# Patient Record
Sex: Male | Born: 1960 | Race: Black or African American | Hispanic: No | Marital: Married | Smoking: Never smoker
Health system: Southern US, Community
[De-identification: ages and names within clinical notes are randomized; demographics above are authoritative.]

## PROBLEM LIST (undated history)

## (undated) HISTORY — PX: COLONOSCOPY: SHX174

## (undated) HISTORY — PX: WISDOM TOOTH EXTRACTION: SHX21

## (undated) HISTORY — PX: VASECTOMY: SHX75

---

## 2010-07-16 ENCOUNTER — Ambulatory Visit: Payer: Self-pay | Admitting: Internal Medicine

## 2010-07-16 DIAGNOSIS — R030 Elevated blood-pressure reading, without diagnosis of hypertension: Secondary | ICD-10-CM | POA: Insufficient documentation

## 2010-07-16 LAB — CONVERTED CEMR LAB
AST: 17 units/L (ref 0–37)
Albumin: 4.6 g/dL (ref 3.5–5.2)
Alkaline Phosphatase: 106 units/L (ref 39–117)
Calcium: 9.8 mg/dL (ref 8.4–10.5)
Cholesterol: 187 mg/dL (ref 0–200)
Creatinine, Ser: 1.07 mg/dL (ref 0.40–1.50)
HDL goal, serum: 40 mg/dL
HDL: 62 mg/dL (ref >39)
LDL Goal: 160 mg/dL
Total Protein: 7.4 g/dL (ref 6.0–8.3)
Triglycerides: 50 mg/dL (ref <150)

## 2010-07-17 ENCOUNTER — Encounter: Payer: Self-pay | Admitting: Internal Medicine

## 2010-09-17 ENCOUNTER — Ambulatory Visit: Payer: Self-pay | Admitting: Internal Medicine

## 2010-12-03 NOTE — Letter (Signed)
   [facility] [address] [place], [place]  [address]  [place] Phone: [phone]      July 17, 2010   Sigurd Berte [address] [place], [place] [address]  RE:  LAB RESULTS  Dear  Mr. Herbold,  The following is an interpretation of your most recent lab tests.  Please take note of any instructions provided or changes to medications that have resulted from your lab work.  ELECTROLYTES:  Good - no changes needed  KIDNEY FUNCTION TESTS:  Good - no changes needed  LIVER FUNCTION TESTS:  Good - no changes needed  LIPID PANEL:  Good - no changes needed Triglyceride: 50   Cholesterol: 187   LDL: 115   HDL: 62   Chol/HDL%:  3.0 Ratio  THYROID STUDIES:  Thyroid studies normal TSH: 1.860          Sincerely Yours,    Dr. Thomos Lemons  Appended Document:  Mailed.

## 2010-12-03 NOTE — Assessment & Plan Note (Signed)
Summary: NEW PT EST CARE/DT BCBS   Vital Signs:  Patient profile:   50 year old male Height:      70.75 inches Weight:      194.50 pounds BMI:     27.42 O2 Sat:      99 % on Room air Temp:     97.8 degrees F oral Pulse rate:   67 / minute Pulse rhythm:   regular Resp:     18 per minute BP sitting:   142 / 84  (left arm) Cuff size:   large  Vitals Entered By: Glendell Docker CMA (July 16, 2010 9:40 AM)  O2 Flow:  Room air  Contraindications/Deferment of Procedures/Staging:    Test/Procedure: FLU VAX    Reason for deferment: patient declined  CC: New Patient , Lipid Management Is Patient Diabetic? No Pain Assessment Patient in pain? no      Comments establlish care, no concerns, fasting for blood work   Primary Care Provider:  D. Thomos Lemons DO  CC:  New Patient  and Lipid Management.  History of Present Illness: 50 y/o  AA male to establish.  husband is Elmo [place] stays physically active through work  hx of elevated BP readings - in college he was evaluated at [facility] - w/u secondary htn reported negative he was tx'ed for 1 yr but stopped treatments.  bp elevation attributed to white coat hypertension follows BP at home - SBP 140's, DBP 70 -80's  Lipid Management History:      Positive NCEP/ATP III risk factors include male age 69 years old or older.  Negative NCEP/ATP III risk factors include non-tobacco-user status.    Preventive Screening-Counseling & Management  Alcohol-Tobacco     Alcohol drinks/day: 0     Smoking Status: never  Caffeine-Diet-Exercise     Caffeine use/day: None     Does Patient Exercise: no  Allergies (verified): No Known Drug Allergies  Past History:  Past Medical History: Hx of elevated BP w/o diagnosis of Htn  Past Surgical History: Denies surgical history Vasectomy   Family History: Family History High cholesterol - mother Family History Hypertension Family History Diabetes 1st degree relative - brother (he  was diagnosed at age 88) parents are living mom 45 , father 49 2 brothers, 1 sister no colon ca no prostate ca  Social History: 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    Physical Exam  General:  alert, well-developed, and well-nourished.   Head:  normocephalic and no abnormalities observed.   Mouth:  pharynx pink and moist.   Neck:  No deformities, masses, or tenderness noted.no carotid bruits.   Lungs:  normal breath sounds, no crackles, and no wheezes.   Heart:  normal rate, regular rhythm, no murmur, and no gallop.   Abdomen:  soft, non-tender, normal bowel sounds, no masses, no hepatomegaly, and no splenomegaly.   Extremities:  No lower extremity edema Neurologic:  cranial nerves II-XII intact and gait normal.   Psych:  normally interactive, good eye contact, not anxious appearing, and not depressed appearing.     Impression &  Recommendations:  Problem # 1:  HEALTH MAINTENANCE EXAM (ICD-V70.0) Reviewed adult health maintenance protocols.  Orders: T-Basic Metabolic Panel [id number]) T-Lipid Profile [id number]) T-TSH [id number]) T-Hepatic Function [id number])  Td Booster: Tdap (07/16/2010)   Flu Vax: Declined (07/16/2010)     Problem # 2:  ELEVATED BLOOD PRESSURE WITHOUT DIAGNOSIS OF HYPERTENSION (ICD-796.2)  BP today: 142/84  10 Yr Risk Heart Disease: Not enough information  Pt counseled on diet and exercise. monitor home bp readings.  consider start ARB  Other Orders: Tdap => 40yrs IM  (30160) Admin 1st Vaccine (10932)  Lipid Assessment/Plan:      Based on NCEP/ATP III, the patient's risk factor category is "0-1 risk factors".  The patient's lipid goals are as follows: Total cholesterol goal is 200; LDL cholesterol goal is 160; HDL cholesterol goal is 40; Triglyceride goal is 150.     Patient Instructions: 1)  Please schedule a follow-up appointment in 2 months. 2)  Bring BP log (at least 10 readings) to your next appointment                                                                                                                                     Current Allergies (reviewed today): No known allergies    Immunization History:  Influenza Immunization History:    Influenza:  declined (07/16/2010)  Immunizations Administered:  Tetanus Vaccine:    Vaccine Type: Tdap    Site: left deltoid    Mfr: GlaxoSmithKline    Dose: 0.5 ml    Route: IM    Given by: Glendell Docker CMA    Exp. Date: 07/24/2012    Lot #: [id number]    VIS given: 09/20/08 version given July 16, 2010.

## 2010-12-03 NOTE — Assessment & Plan Note (Signed)
Summary: 2 month fu/dt   Vital Signs:  Patient profile:   50 year old male Height:      70.75 inches Weight:      200.75 pounds BMI:     28.30 O2 Sat:      100 % on Room air Temp:     98.3 degrees F oral Pulse rate:   81 / minute Resp:     16 per minute BP sitting:   130 / 70  (right arm) Cuff size:   large  Vitals Entered By: Glendell Docker CMA (September 17, 2010 9:30 AM)  O2 Flow:  Room air CC: 2 Month Follow up  Is Patient Diabetic? No Pain Assessment Patient in pain? no      Comments no concerns   Primary Care Provider:  Dondra Spry DO  CC:  2 Month Follow up .  History of Present Illness: 50 y/o AA male for f/u he has been monitoring bp at home occ elevated readings he has decrease salt intake.  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   Lungs:  normal breath sounds, no crackles, and no wheezes.   Heart:  normal rate, regular rhythm, no murmur, and no gallop.   Extremities:  No lower extremity edema   Impression & Recommendations:  Problem # 1:  ELEVATED BLOOD PRESSURE WITHOUT DIAGNOSIS OF HYPERTENSION (ICD-796.2) Pt with occ elevated readings at home.  130-140's systolic continue with DASH  diet for now.  Pt advised to schedule visit with opthalmogist.  If pt has any signs of retinal hypertensive changes, we discussed starting low dose ACE or ARB  BP today: 130/70 Prior BP: 142/84 (07/16/2010)  Prior 10 Yr Risk Heart Disease: Not enough information (07/16/2010)  Labs Reviewed: Creat: 1.07 (07/16/2010) Chol: 187 (07/16/2010)   HDL: 62 (07/16/2010)   LDL: 115 (07/16/2010)   TG: 50 (07/16/2010)  Instructed in low sodium diet (DASH Handout) and behavior modification.    Patient Instructions: 1)  Follow low salt diet - limit to 2.5 grams per day 2)  Please schedule a follow-up appointment in 1 year. 3)  Follow up eye doctor. 4)  BMP prior to visit, ICD-9:  401.9 5)  Lipid Panel prior to visit, ICD-9:  401.9 6)  High sensitivity CRP :  401.9 7)  Please return for lab work one (1) week before your next appointment.    Orders Added: 1)  Est. Patient Level III [[address]]        Current Allergies (reviewed today): No known allergies

## 2012-08-27 ENCOUNTER — Encounter: Payer: Self-pay | Admitting: Internal Medicine

## 2012-08-27 ENCOUNTER — Encounter: Payer: Self-pay | Admitting: Gastroenterology

## 2012-08-27 ENCOUNTER — Telehealth: Payer: Self-pay | Admitting: Internal Medicine

## 2012-08-27 ENCOUNTER — Ambulatory Visit (INDEPENDENT_AMBULATORY_CARE_PROVIDER_SITE_OTHER): Payer: BC Managed Care – PPO | Admitting: Internal Medicine

## 2012-08-27 VITALS — BP 128/80 | HR 68 | Temp 97.8°F | Resp 16 | Ht 70.5 in | Wt 201.5 lb

## 2012-08-27 DIAGNOSIS — Z0000 Encounter for general adult medical examination without abnormal findings: Secondary | ICD-10-CM | POA: Insufficient documentation

## 2012-08-27 DIAGNOSIS — Z1211 Encounter for screening for malignant neoplasm of colon: Secondary | ICD-10-CM

## 2012-08-27 NOTE — Assessment & Plan Note (Addendum)
Normal exam. EKG obtained demonstrates normal sinus rhythm with evidence of early repolarization. Axis and intervals normal. Obtain CPE labs including PSA (discussed pros and cons). Declines influenza vaccine. Colonoscopy referral

## 2012-08-27 NOTE — Telephone Encounter (Signed)
Lab order Monday 08-30-2012 and lab order 08-16-2013 Cbc, chem7, lipid, lft, tsh, psa and ua with reflex micro-v70.0

## 2012-08-27 NOTE — Progress Notes (Signed)
  Subjective:    Patient ID: Gary Colon, male    DOB: [birth year], 51 y.o.   MRN: [id number]  HPI Pt presents to clinic for annual exam. No active complaints. Has not undergone colonoscopy but is willing to do so. Declines influenza vaccine No past medical history on file. No past surgical history on file.  does not have a smoking history on file. He does not have any smokeless tobacco history on file. His alcohol and drug histories not on file. family history is not on file. Allergies not on file   Review of Systems see hpi     Objective:   Physical Exam  Physical Exam  Nursing note and vitals reviewed. Constitutional: He appears well-developed and well-nourished. No distress.  HENT:  Head: Normocephalic and atraumatic.  Right Ear: Tympanic membrane and external ear normal.  Left Ear: Tympanic membrane and external ear normal.  Nose: Nose normal.  Mouth/Throat: Uvula is midline, oropharynx is clear and moist and mucous membranes are normal. No oropharyngeal exudate.  Eyes: Conjunctivae and EOM are normal. Pupils are equal, round, and reactive to light. Right eye exhibits no discharge. Left eye exhibits no discharge. No scleral icterus.  Neck: Neck supple. Carotid bruit is not present. No thyromegaly present.  Cardiovascular: Normal rate, regular rhythm and normal heart sounds.  Exam reveals no gallop and no friction rub.   No murmur heard. Pulmonary/Chest: Effort normal and breath sounds normal. No respiratory distress. He has no wheezes. He has no rales.  Abdominal: Soft. He exhibits no distension and no mass. There is no hepatosplenomegaly. There is no tenderness. There is no rebound. Hernia confirmed negative in the right inguinal area and confirmed negative in the left inguinal area.  Lymphadenopathy:    He has no cervical adenopathy.      Neurological: He is alert.  Skin: Skin is warm and dry. He is not diaphoretic.  Psychiatric: He has a normal mood and affect.          Assessment & Plan:

## 2012-08-27 NOTE — Patient Instructions (Signed)
Please schedule fasting labs for Monday Cbc, chem7, lipid, lft, tsh, psa and ua with reflex micro-v70.0 Also please schedule the same labs prior to your next year's physical

## 2012-08-30 ENCOUNTER — Telehealth: Payer: Self-pay | Admitting: *Deleted

## 2012-08-30 DIAGNOSIS — Z0000 Encounter for general adult medical examination without abnormal findings: Secondary | ICD-10-CM

## 2012-08-30 LAB — CBC WITH DIFFERENTIAL/PLATELET
Basophils Relative: 0 % (ref 0–1)
Eosinophils Absolute: 0.1 10*3/uL (ref 0.0–0.7)
Eosinophils Relative: 1 % (ref 0–5)
Hemoglobin: 14.6 g/dL (ref 13.0–17.0)
MCH: 28.9 pg (ref 26.0–34.0)
MCHC: 34.2 g/dL (ref 30.0–36.0)
MCV: 84.6 fL (ref 78.0–100.0)
Monocytes Absolute: 0.6 10*3/uL (ref 0.1–1.0)
Monocytes Relative: 10 % (ref 3–12)
Neutrophils Relative %: 59 % (ref 43–77)

## 2012-08-30 LAB — BASIC METABOLIC PANEL
BUN: 15 mg/dL (ref 6–23)
Chloride: 103 mEq/L (ref 96–112)
Creat: 1.2 mg/dL (ref 0.50–1.35)

## 2012-08-30 LAB — HEPATIC FUNCTION PANEL
ALT: 19 U/L (ref 0–53)
Bilirubin, Direct: <0.1 mg/dL (ref 0.0–0.3)
Total Bilirubin: 0.2 mg/dL — ABNORMAL LOW (ref 0.3–1.2)

## 2012-08-30 LAB — LIPID PANEL
Cholesterol: 171 mg/dL (ref 0–200)
Triglycerides: 64 mg/dL (ref <150)
VLDL: 13 mg/dL (ref 0–40)

## 2012-08-30 LAB — TSH: TSH: 2.068 u[IU]/mL (ref 0.350–4.500)

## 2012-08-30 NOTE — Telephone Encounter (Signed)
Pt presented to the lab. Orders entered per 08/27/12 office visit.  Please schedule fasting labs for Monday  Cbc, chem7, lipid, lft, tsh, psa and ua with reflex micro-v70.0

## 2012-08-31 LAB — URINALYSIS, ROUTINE W REFLEX MICROSCOPIC
Bilirubin Urine: NEGATIVE
Glucose, UA: NEGATIVE mg/dL
Ketones, ur: NEGATIVE mg/dL
Specific Gravity, Urine: 1.025 (ref 1.005–1.030)
pH: 6 (ref 5.0–8.0)

## 2012-09-14 ENCOUNTER — Ambulatory Visit (AMBULATORY_SURGERY_CENTER): Payer: BC Managed Care – PPO

## 2012-09-14 ENCOUNTER — Encounter: Payer: Self-pay | Admitting: Gastroenterology

## 2012-09-14 VITALS — Ht 71.5 in | Wt 207.1 lb

## 2012-09-14 DIAGNOSIS — Z1211 Encounter for screening for malignant neoplasm of colon: Secondary | ICD-10-CM

## 2012-09-14 MED ORDER — NA SULFATE-K SULFATE-MG SULF 17.5-3.13-1.6 GM/177ML PO SOLN: 1.0000 | Freq: Once | ORAL | Status: DC

## 2012-10-04 ENCOUNTER — Telehealth: Payer: Self-pay | Admitting: Gastroenterology

## 2012-10-04 NOTE — Telephone Encounter (Signed)
Checked with pt's pharmacy.  Suprep rx is ready for pt to pickup.  Pt notified.

## 2012-10-06 ENCOUNTER — Encounter: Payer: Self-pay | Admitting: Gastroenterology

## 2012-10-06 ENCOUNTER — Ambulatory Visit (AMBULATORY_SURGERY_CENTER): Payer: BC Managed Care – PPO | Admitting: Gastroenterology

## 2012-10-06 VITALS — BP 116/68 | HR 62 | Temp 97.4°F | Resp 20 | Ht 72.0 in | Wt 207.0 lb

## 2012-10-06 DIAGNOSIS — Z1211 Encounter for screening for malignant neoplasm of colon: Secondary | ICD-10-CM

## 2012-10-06 MED ORDER — SODIUM CHLORIDE 0.9 % IV SOLN: 500.0000 mL | INTRAVENOUS | Status: DC

## 2012-10-06 NOTE — Patient Instructions (Signed)
Normal colonoscopy   YOU HAD AN ENDOSCOPIC PROCEDURE TODAY AT THE [facility]: Refer to the procedure report that was given to you for any specific questions about what was found during the examination.  If the procedure report does not answer your questions, please call your gastroenterologist to clarify.  If you requested that your care partner not be given the details of your procedure findings, then the procedure report has been included in a sealed envelope for you to review at your convenience later.  YOU SHOULD EXPECT: Some feelings of bloating in the abdomen. Passage of more gas than usual.  Walking can help get rid of the air that was put into your GI tract during the procedure and reduce the bloating. If you had a lower endoscopy (such as a colonoscopy or flexible sigmoidoscopy) you may notice spotting of blood in your stool or on the toilet paper. If you underwent a bowel prep for your procedure, then you may not have a normal bowel movement for a few days.  DIET: Your first meal following the procedure should be a light meal and then it is ok to progress to your normal diet.  A half-sandwich or bowl of soup is an example of a good first meal.  Heavy or fried foods are harder to digest and may make you feel nauseous or bloated.  Likewise meals heavy in dairy and vegetables can cause extra gas to form and this can also increase the bloating.  Drink plenty of fluids but you should avoid alcoholic beverages for 24 hours.  ACTIVITY: Your care partner should take you home directly after the procedure.  You should plan to take it easy, moving slowly for the rest of the day.  You can resume normal activity the day after the procedure however you should NOT DRIVE or use heavy machinery for 24 hours (because of the sedation medicines used during the test).    SYMPTOMS TO REPORT IMMEDIATELY: A gastroenterologist can be reached at any hour.  During normal business hours, 8:30 AM to 5:00 PM  Monday through Friday, call [phone].  After hours and on weekends, please call the GI answering service at [phone] who will take a message and have the physician on call contact you.   Following lower endoscopy (colonoscopy or flexible sigmoidoscopy):  Excessive amounts of blood in the stool  Significant tenderness or worsening of abdominal pains  Swelling of the abdomen that is new, acute  Fever of 100F or higher   FOLLOW UP: If any biopsies were taken you will be contacted by phone or by letter within the next 1-3 weeks.  Call your gastroenterologist if you have not heard about the biopsies in 3 weeks.  Our staff will call the home number listed on your records the next business day following your procedure to check on you and address any questions or concerns that you may have at that time regarding the information given to you following your procedure. This is a courtesy call and so if there is no answer at the home number and we have not heard from you through the emergency physician on call, we will assume that you have returned to your regular daily activities without incident.  SIGNATURES/CONFIDENTIALITY: You and/or your care partner have signed paperwork which will be entered into your electronic medical record.  These signatures attest to the fact that that the information above on your After Visit Summary has been reviewed and is understood.  Full responsibility  of the confidentiality of this discharge information lies with you and/or your care-partner.

## 2012-10-06 NOTE — Op Note (Signed)
[facility] [address] [place], [address]   COLONOSCOPY PROCEDURE REPORT  PATIENT: Gary Colon, Gary Colon  MR#: [id number] BIRTHDATE: 1961-09-27 , 51  yrs. old GENDER: Male ENDOSCOPIST: Louis Meckel, MD REFERRED BY: PROCEDURE DATE:  10/06/2012 PROCEDURE:   Colonoscopy, diagnostic ASA CLASS:   Class I INDICATIONS:average risk screening. MEDICATIONS: MAC sedation, administered by CRNA and propofol (Diprivan) 250mg  IV  DESCRIPTION OF PROCEDURE:   After the risks benefits and alternatives of the procedure were thoroughly explained, informed consent was obtained.  A digital rectal exam revealed no abnormalities of the rectum.   The LB [id number]  endoscope was introduced through the anus and advanced to the cecum, which was identified by both the appendix and ileocecal valve. No adverse events experienced.   The quality of the prep was Suprep excellent The instrument was then slowly withdrawn as the colon was fully examined.      COLON FINDINGS: A normal appearing cecum, ileocecal valve, and appendiceal orifice were identified.  The ascending, hepatic flexure, transverse, splenic flexure, descending, sigmoid colon and rectum appeared unremarkable.  No polyps or cancers were seen. Retroflexed views revealed no abnormalities. The time to cecum=3 minutes 0 seconds.  Withdrawal time=6 minutes 0 seconds.  The scope was withdrawn and the procedure completed. COMPLICATIONS: There were no complications.  ENDOSCOPIC IMPRESSION: Normal colon  RECOMMENDATIONS: Continue current colorectal screening recommendations for "routine risk" patients with a repeat colonoscopy in 10 years.   eSigned:  Louis Meckel, MD 10/06/2012 12:06 PM   cc:

## 2012-10-06 NOTE — Progress Notes (Signed)
Patient did not experience any of the following events: a burn prior to discharge; a fall within the facility; wrong site/side/patient/procedure/implant event; or a hospital transfer or hospital admission upon discharge from the facility. (G8907) Patient did not have preoperative order for IV antibiotic SSI prophylaxis. (G8918)  

## 2012-10-07 ENCOUNTER — Telehealth: Payer: Self-pay | Admitting: *Deleted

## 2012-10-07 NOTE — Telephone Encounter (Signed)
  Follow up Call-  Call back number 10/06/2012  Post procedure Call Back phone  # [phone]  Permission to leave phone message Yes     Patient questions:  Message left to call if necessary.

## 2013-08-10 ENCOUNTER — Encounter: Payer: Self-pay | Admitting: Internal Medicine

## 2013-08-29 ENCOUNTER — Encounter: Payer: BC Managed Care – PPO | Admitting: Internal Medicine

## 2013-08-31 ENCOUNTER — Encounter: Payer: Self-pay | Admitting: Physician Assistant

## 2013-08-31 ENCOUNTER — Ambulatory Visit (INDEPENDENT_AMBULATORY_CARE_PROVIDER_SITE_OTHER): Payer: BC Managed Care – PPO | Admitting: Physician Assistant

## 2013-08-31 VITALS — BP 136/88 | HR 70 | Temp 97.7°F | Ht 71.0 in | Wt 203.8 lb

## 2013-08-31 DIAGNOSIS — Z0000 Encounter for general adult medical examination without abnormal findings: Secondary | ICD-10-CM

## 2013-08-31 LAB — CBC WITH DIFFERENTIAL/PLATELET
Basophils Absolute: 0 10*3/uL (ref 0.0–0.1)
Basophils Relative: 0 % (ref 0–1)
Eosinophils Absolute: 0 10*3/uL (ref 0.0–0.7)
Hemoglobin: 14.8 g/dL (ref 13.0–17.0)
MCH: 29.2 pg (ref 26.0–34.0)
MCHC: 34.3 g/dL (ref 30.0–36.0)
Monocytes Absolute: 0.6 10*3/uL (ref 0.1–1.0)
Monocytes Relative: 8 % (ref 3–12)
Neutro Abs: 4.7 10*3/uL (ref 1.7–7.7)
Neutrophils Relative %: 71 % (ref 43–77)
RDW: 13.7 % (ref 11.5–15.5)

## 2013-08-31 LAB — BASIC METABOLIC PANEL
BUN: 16 mg/dL (ref 6–23)
Calcium: 9.9 mg/dL (ref 8.4–10.5)
Glucose, Bld: 106 mg/dL — ABNORMAL HIGH (ref 70–99)
Sodium: 138 mEq/L (ref 135–145)

## 2013-08-31 LAB — HEMOGLOBIN A1C
Hgb A1c MFr Bld: 5.8 % — ABNORMAL HIGH (ref <5.7)
Mean Plasma Glucose: 120 mg/dL — ABNORMAL HIGH (ref <117)

## 2013-08-31 LAB — LIPID PANEL
Cholesterol: 174 mg/dL (ref 0–200)
HDL: 62 mg/dL (ref >39)
LDL Cholesterol: 104 mg/dL — ABNORMAL HIGH (ref 0–99)
Total CHOL/HDL Ratio: 2.8 Ratio
Triglycerides: 40 mg/dL (ref <150)
VLDL: 8 mg/dL (ref 0–40)

## 2013-08-31 LAB — HEPATIC FUNCTION PANEL
Albumin: 4.7 g/dL (ref 3.5–5.2)
Bilirubin, Direct: 0.1 mg/dL (ref 0.0–0.3)
Total Bilirubin: 0.4 mg/dL (ref 0.3–1.2)

## 2013-08-31 LAB — TSH: TSH: 1.456 u[IU]/mL (ref 0.350–4.500)

## 2013-08-31 NOTE — Assessment & Plan Note (Signed)
Will obtain fasting labs today, including PSA.  Patient up-to-date on health maintenance.  Declines flu shot.  Encourage patient to begin a daily multivitamin.

## 2013-08-31 NOTE — Progress Notes (Signed)
Patient ID: Gary Colon, male   DOB: [birth year], 52 y.o.   MRN: [id number]  Patient presents today for annual exam.  Acute Concerns: No acute concerns  Chronic Issues: No significant PMH  Health Maintenance: Dental -- up-to-date Vision -- overdue. Colonoscopy -- 2013; no abnormal findings -- due in 10 years. Immunizations -- up-to-date.  Declines flu shot.   History reviewed. No pertinent past medical history.  No current outpatient prescriptions on file prior to visit.   No current facility-administered medications on file prior to visit.    No Known Allergies  Family History  Problem Relation Age of Onset  . Gallstones Father   . Hypertension Mother   . Healthy Brother     x2  . Healthy Sister     x1  . Healthy Son     x2    History   Social History  . Marital Status: Married    Spouse Name: N/A    Number of Children: N/A  . Years of Education: N/A   Social History Main Topics  . Smoking status: Never Smoker   . Smokeless tobacco: Never Used  . Alcohol Use: No  . Drug Use: No  . Sexual Activity: Yes    Birth Control/ Protection: None     Comment: wife   Other Topics Concern  . None   Social History Narrative  . None   Review of Systems  Constitutional: Negative for fever, chills, weight loss and malaise/fatigue.  HENT: Negative for ear discharge, ear pain, hearing loss and tinnitus.   Eyes: Negative for blurred vision, double vision, photophobia and pain.  Respiratory: Negative for cough, shortness of breath and wheezing.   Cardiovascular: Negative for chest pain and palpitations.  Gastrointestinal: Negative for heartburn, nausea, vomiting, abdominal pain, diarrhea, constipation, blood in stool and melena.  Genitourinary: Negative for dysuria, urgency, frequency, hematuria and flank pain.  Neurological: Negative for dizziness, seizures, loss of consciousness and headaches.  Endo/Heme/Allergies: Negative for environmental allergies.   Psychiatric/Behavioral: Negative for depression, suicidal ideas, hallucinations and substance abuse. The patient is not nervous/anxious and does not have insomnia.    Filed Vitals:   08/31/13 0839  BP: 136/88  Pulse: 70  Temp: 97.7 F (36.5 C)    Physical Exam  Vitals reviewed. Constitutional: He is oriented to person, place, and time and well-developed, well-nourished, and in no distress.  HENT:  Head: Normocephalic and atraumatic.  Right Ear: External ear normal.  Left Ear: External ear normal.  Nose: Nose normal.  Mouth/Throat: Oropharynx is clear and moist. No oropharyngeal exudate.  Tympanic membranes WNL bilaterally.  Eyes: Conjunctivae and EOM are normal. Pupils are equal, round, and reactive to light. No scleral icterus.  Neck: Normal range of motion. Neck supple.  Cardiovascular: Normal rate, regular rhythm, normal heart sounds and intact distal pulses.   Pulmonary/Chest: Effort normal and breath sounds normal. No respiratory distress. He has no wheezes. He has no rales. He exhibits no tenderness.  Abdominal: Soft. Bowel sounds are normal. He exhibits no distension and no mass. There is no tenderness. There is no rebound and no guarding.  Musculoskeletal: Normal range of motion.  Lymphadenopathy:    He has no cervical adenopathy.  Neurological: He is alert and oriented to person, place, and time. No cranial nerve deficit.  Skin: Skin is warm and dry. No rash noted.  Psychiatric: Affect normal.   Assessment/Plan: Visit for preventive health examination Will obtain fasting labs today, including PSA.  Patient up-to-date on health maintenance.  Declines flu shot.  Encourage patient to begin a daily multivitamin.

## 2013-08-31 NOTE — Patient Instructions (Signed)
Please obtain labs.  I will call you with your results.  Consider taking a daily multivitamin like Men's One a Day.  Please return to clinic in 1 year for annual exam and/or sooner when you need [place].

## 2013-09-01 LAB — URINALYSIS, ROUTINE W REFLEX MICROSCOPIC
Bilirubin Urine: NEGATIVE
Glucose, UA: NEGATIVE mg/dL
Hgb urine dipstick: NEGATIVE
Ketones, ur: NEGATIVE mg/dL
Leukocytes, UA: NEGATIVE
Nitrite: NEGATIVE
Protein, ur: NEGATIVE mg/dL
Specific Gravity, Urine: 1.012 (ref 1.005–1.030)
pH: 6 (ref 5.0–8.0)

## 2013-09-08 ENCOUNTER — Other Ambulatory Visit: Payer: Self-pay

## 2014-09-01 ENCOUNTER — Telehealth: Payer: Self-pay | Admitting: *Deleted

## 2014-09-01 ENCOUNTER — Encounter: Payer: BC Managed Care – PPO | Admitting: Physician Assistant

## 2014-09-01 DIAGNOSIS — Z0289 Encounter for other administrative examinations: Secondary | ICD-10-CM

## 2014-09-01 NOTE — Telephone Encounter (Signed)
Patient can reschedule at his earliest convenience.

## 2014-09-01 NOTE — Telephone Encounter (Signed)
Pt did not show for appointment 09/01/2014 at 8:30am for CPE

## 2016-07-17 ENCOUNTER — Other Ambulatory Visit: Payer: Self-pay | Admitting: Family Medicine

## 2016-07-22 ENCOUNTER — Other Ambulatory Visit: Payer: Self-pay | Admitting: Family Medicine

## 2016-07-22 DIAGNOSIS — N5089 Other specified disorders of the male genital organs: Secondary | ICD-10-CM

## 2016-07-28 ENCOUNTER — Ambulatory Visit
Admission: RE | Admit: 2016-07-28 | Discharge: 2016-07-28 | Disposition: A | Discharge status: Home / Self Care | Payer: BLUE CROSS/BLUE SHIELD | Source: Ambulatory Visit | Attending: Family Medicine | Admitting: Family Medicine

## 2016-07-28 DIAGNOSIS — N5089 Other specified disorders of the male genital organs: Secondary | ICD-10-CM

## 2016-12-29 ENCOUNTER — Other Ambulatory Visit: Payer: Self-pay | Admitting: *Deleted

## 2017-07-03 ENCOUNTER — Other Ambulatory Visit: Payer: Self-pay | Admitting: Family Medicine

## 2017-07-03 DIAGNOSIS — R1083 Colic: Secondary | ICD-10-CM

## 2017-07-03 DIAGNOSIS — R1011 Right upper quadrant pain: Secondary | ICD-10-CM

## 2017-07-10 ENCOUNTER — Ambulatory Visit
Admission: RE | Admit: 2017-07-10 | Discharge: 2017-07-10 | Disposition: A | Discharge status: Home / Self Care | Payer: BC Managed Care – PPO | Source: Ambulatory Visit | Attending: Family Medicine | Admitting: Family Medicine

## 2017-07-10 DIAGNOSIS — R1083 Colic: Secondary | ICD-10-CM

## 2017-07-10 DIAGNOSIS — R1011 Right upper quadrant pain: Secondary | ICD-10-CM

## 2017-07-13 ENCOUNTER — Emergency Department (HOSPITAL_COMMUNITY)
Admission: EM | Admit: 2017-07-13 | Discharge: 2017-07-13 | Disposition: A | Discharge status: Home / Self Care | Payer: BC Managed Care – PPO | Attending: Emergency Medicine | Admitting: Emergency Medicine

## 2017-07-13 ENCOUNTER — Encounter (HOSPITAL_COMMUNITY): Payer: Self-pay

## 2017-07-13 DIAGNOSIS — K8050 Calculus of bile duct without cholangitis or cholecystitis without obstruction: Secondary | ICD-10-CM | POA: Insufficient documentation

## 2017-07-13 DIAGNOSIS — R1013 Epigastric pain: Secondary | ICD-10-CM | POA: Diagnosis present

## 2017-07-13 LAB — COMPREHENSIVE METABOLIC PANEL
ALT: 25 U/L (ref 17–63)
AST: 20 U/L (ref 15–41)
Albumin: 4.4 g/dL (ref 3.5–5.0)
Alkaline Phosphatase: 125 U/L (ref 38–126)
Anion gap: 11 (ref 5–15)
BUN: 9 mg/dL (ref 6–20)
CHLORIDE: 104 mmol/L (ref 101–111)
CO2: 26 mmol/L (ref 22–32)
CREATININE: 0.99 mg/dL (ref 0.61–1.24)
Calcium: 9.5 mg/dL (ref 8.9–10.3)
GFR calc non Af Amer: >60 mL/min (ref >60)
Glucose, Bld: 120 mg/dL — ABNORMAL HIGH (ref 65–99)
Potassium: 3.5 mmol/L (ref 3.5–5.1)
SODIUM: 141 mmol/L (ref 135–145)
Total Bilirubin: 0.6 mg/dL (ref 0.3–1.2)
Total Protein: 7.8 g/dL (ref 6.5–8.1)

## 2017-07-13 LAB — CBC
HCT: 42.4 % (ref 39.0–52.0)
HEMOGLOBIN: 14.6 g/dL (ref 13.0–17.0)
MCH: 29 pg (ref 26.0–34.0)
MCHC: 34.4 g/dL (ref 30.0–36.0)
MCV: 84.1 fL (ref 78.0–100.0)
Platelets: 309 10*3/uL (ref 150–400)
RBC: 5.04 MIL/uL (ref 4.22–5.81)
RDW: 13 % (ref 11.5–15.5)
WBC: 13.1 10*3/uL — ABNORMAL HIGH (ref 4.0–10.5)

## 2017-07-13 LAB — LIPASE, BLOOD: LIPASE: 21 U/L (ref 11–51)

## 2017-07-13 NOTE — ED Triage Notes (Signed)
Patient c/o epigastric pain and occasional vomit x 1 month. Patient had an [place] of the abdomen  3days ago. Patient has not gotten results, but was told to come to the ED if he still had abdominal pain.

## 2017-07-13 NOTE — ED Provider Notes (Signed)
[facility]-EMERGENCY DEPT Provider Note   CSN: [id number] Arrival date & time: 07/13/17  1716     History   Chief Complaint Chief Complaint  Patient presents with  . Abdominal Pain  . Emesis    HPI Gary Colon is a 56 y.o. male.  The history is provided by the patient.  Abdominal Pain   This is a new problem. Episode onset: 1 month overall. this episode started last night. Episode frequency: intermittent; constant this time. The problem has been gradually improving. Associated with: US 3 days ago by PCP showed Gallstone, but no inflammation. The pain is located in the epigastric region. The pain is mild. Associated symptoms include nausea and vomiting (NBNB). Pertinent negatives include anorexia, diarrhea and constipation. The symptoms are aggravated by eating. Nothing relieves the symptoms. Past workup includes ultrasound. His past medical history is significant for gallstones.  Emesis   Associated symptoms include abdominal pain. Pertinent negatives include no diarrhea.    History reviewed. No pertinent past medical history.  Patient Active Problem List   Diagnosis Date Noted  . Visit for preventive health examination 08/31/2013  . ELEVATED BLOOD PRESSURE WITHOUT DIAGNOSIS OF HYPERTENSION 07/16/2010    Past Surgical History:  Procedure Laterality Date  . VASECTOMY    . WISDOM TOOTH EXTRACTION         Home Medications    Prior to Admission medications   Not on File    Family History Family History  Problem Relation Age of Onset  . Hypertension Mother   . Gallstones Father   . Healthy Brother        x2  . Healthy Sister        x1  . Healthy Son        x2    Social History Social History  Substance Use Topics  . Smoking status: Never Smoker  . Smokeless tobacco: Never Used  . Alcohol use No     Allergies   Patient has no known allergies.   Review of Systems Review of Systems  Gastrointestinal: Positive for abdominal pain, nausea and vomiting  (NBNB). Negative for anorexia, constipation and diarrhea.   All other systems are reviewed and are negative for acute change except as noted in the HPI   Physical Exam Updated Vital Signs BP (!) 166/88 (BP Location: Left Arm)   Pulse 81   Temp 99.1 F (37.3 C) (Oral)   Resp 19   Ht 6\' 1"  (1.854 m)   Wt 95.3 kg (210 lb)   SpO2 100%   BMI 27.71 kg/m   Physical Exam  Constitutional: He is oriented to person, place, and time. He appears well-developed and well-nourished. No distress.  HENT:  Head: Normocephalic and atraumatic.  Nose: Nose normal.  Eyes: Pupils are equal, round, and reactive to light. Conjunctivae and EOM are normal. Right eye exhibits no discharge. Left eye exhibits no discharge. No scleral icterus.  Neck: Normal range of motion. Neck supple.  Cardiovascular: Normal rate and regular rhythm.  Exam reveals no gallop and no friction rub.   No murmur heard. Pulmonary/Chest: Effort normal and breath sounds normal. No stridor. No respiratory distress. He has no rales.  Abdominal: Soft. He exhibits no distension. There is no tenderness. There is no rigidity, no rebound and no guarding.  Musculoskeletal: He exhibits no edema or tenderness.  Neurological: He is alert and oriented to person, place, and time.  Skin: Skin is warm and dry. No rash noted. He is not diaphoretic. No erythema.  Psychiatric: He has a normal mood and affect.  Vitals reviewed.    ED Treatments / Results  Labs (all labs ordered are listed, but only abnormal results are displayed) Labs Reviewed  COMPREHENSIVE METABOLIC PANEL - Abnormal; Notable for the following:       Result Value   Glucose, Bld 120 (*)    All other components within normal limits  CBC - Abnormal; Notable for the following:    WBC 13.1 (*)    All other components within normal limits  LIPASE, BLOOD  URINALYSIS, ROUTINE W REFLEX MICROSCOPIC    EKG  EKG Interpretation None       Radiology No results  found.  Procedures Procedures (including critical care time)  Medications Ordered in ED Medications - No data to display   Initial Impression / Assessment and Plan / ED Course  I have reviewed the triage vital signs and the nursing notes.  Pertinent labs & imaging results that were available during my care of the patient were reviewed by me and considered in my medical decision making (see chart for details).     The presentation is consistent with biliary colic. Labs without evidence of biliary obstruction and or pancreatitis. Abdomen benign. Low suspicion for acute cholecystitis. Recommended diet change. We'll provide patient with contact information to general surgery.  The patient is safe for discharge with strict return precautions.   Final Clinical Impressions(s) / ED Diagnoses   Final diagnoses:  Recurrent biliary colic   Disposition: Discharge  Condition: Good  I have discussed the results, Dx and Tx plan with the patient who expressed understanding and agree(s) with the plan. Discharge instructions discussed at great length. The patient was given strict return precautions who verbalized understanding of the instructions. No further questions at time of discharge.    New Prescriptions   No medications on file    Follow Up: Andria Meuse, MD [address] [place] [address] [phone]  Schedule an appointment as soon as possible for a visit  For evaluation of gallstones and possible need for gallbladder removal if your symptoms continue to cause significant problems.  Ileana L[place], MD [address] [place] [address] [phone]  Schedule an appointment as soon as possible for a visit  As needed      Nira Conn, MD 07/13/17 2150

## 2017-07-17 ENCOUNTER — Ambulatory Visit: Payer: Self-pay | Admitting: Surgery

## 2017-07-20 NOTE — Progress Notes (Signed)
Unable to reach pt for pre-op call. Tried several times and left message, but no return call. Left pre-op instructions on pt's voicemail.

## 2017-07-21 ENCOUNTER — Ambulatory Visit (HOSPITAL_COMMUNITY): Payer: BC Managed Care – PPO | Admitting: Anesthesiology

## 2017-07-21 ENCOUNTER — Encounter (HOSPITAL_COMMUNITY)
Admission: RE | Disposition: A | Discharge status: Home / Self Care | Payer: Self-pay | Source: Ambulatory Visit | Attending: Surgery

## 2017-07-21 ENCOUNTER — Encounter (HOSPITAL_COMMUNITY): Payer: Self-pay | Admitting: General Practice

## 2017-07-21 ENCOUNTER — Ambulatory Visit (HOSPITAL_COMMUNITY): Payer: BC Managed Care – PPO

## 2017-07-21 ENCOUNTER — Ambulatory Visit (HOSPITAL_COMMUNITY)
Admission: RE | Admit: 2017-07-21 | Discharge: 2017-07-21 | Disposition: A | Discharge status: Home / Self Care | Payer: BC Managed Care – PPO | Source: Ambulatory Visit | Attending: Surgery | Admitting: Surgery

## 2017-07-21 DIAGNOSIS — Z8249 Family history of ischemic heart disease and other diseases of the circulatory system: Secondary | ICD-10-CM | POA: Diagnosis not present

## 2017-07-21 DIAGNOSIS — R160 Hepatomegaly, not elsewhere classified: Secondary | ICD-10-CM | POA: Insufficient documentation

## 2017-07-21 DIAGNOSIS — K8010 Calculus of gallbladder with chronic cholecystitis without obstruction: Secondary | ICD-10-CM | POA: Diagnosis present

## 2017-07-21 DIAGNOSIS — I10 Essential (primary) hypertension: Secondary | ICD-10-CM | POA: Insufficient documentation

## 2017-07-21 HISTORY — PX: CHOLECYSTECTOMY: SHX55

## 2017-07-21 LAB — BASIC METABOLIC PANEL
Anion gap: 8 (ref 5–15)
BUN: 11 mg/dL (ref 6–20)
CHLORIDE: 102 mmol/L (ref 101–111)
CO2: 27 mmol/L (ref 22–32)
Calcium: 9.2 mg/dL (ref 8.9–10.3)
Creatinine, Ser: 1.22 mg/dL (ref 0.61–1.24)
GFR calc non Af Amer: >60 mL/min (ref >60)
Glucose, Bld: 121 mg/dL — ABNORMAL HIGH (ref 65–99)
POTASSIUM: 3.8 mmol/L (ref 3.5–5.1)
Sodium: 137 mmol/L (ref 135–145)

## 2017-07-21 LAB — CBC
HCT: 43.9 % (ref 39.0–52.0)
HEMOGLOBIN: 14.9 g/dL (ref 13.0–17.0)
MCH: 28.8 pg (ref 26.0–34.0)
MCHC: 33.9 g/dL (ref 30.0–36.0)
MCV: 84.7 fL (ref 78.0–100.0)
Platelets: 307 10*3/uL (ref 150–400)
RBC: 5.18 MIL/uL (ref 4.22–5.81)
RDW: 12.5 % (ref 11.5–15.5)
WBC: 6.7 10*3/uL (ref 4.0–10.5)

## 2017-07-21 SURGERY — LAPAROSCOPIC CHOLECYSTECTOMY WITH INTRAOPERATIVE CHOLANGIOGRAM
Anesthesia: General | Site: Abdomen

## 2017-07-21 MED ORDER — CHLORHEXIDINE GLUCONATE CLOTH 2 % EX PADS: 6.0000 | MEDICATED_PAD | Freq: Once | CUTANEOUS | Status: DC

## 2017-07-21 MED ORDER — SUGAMMADEX SODIUM 200 MG/2ML IV SOLN
INTRAVENOUS | Status: DC | PRN
  Administered 2017-07-21: 200 mg via INTRAVENOUS

## 2017-07-21 MED ORDER — LACTATED RINGERS IV SOLN
INTRAVENOUS | Status: DC
  Administered 2017-07-21: 10:00:00 via INTRAVENOUS

## 2017-07-21 MED ORDER — LIDOCAINE 2% (20 MG/ML) 5 ML SYRINGE
INTRAMUSCULAR | Status: AC
  Filled 2017-07-21: qty 5

## 2017-07-21 MED ORDER — LIDOCAINE HCL (CARDIAC) 20 MG/ML IV SOLN
INTRAVENOUS | Status: DC | PRN
  Administered 2017-07-21: 40 mg via INTRAVENOUS

## 2017-07-21 MED ORDER — MIDAZOLAM HCL 2 MG/2ML IJ SOLN
INTRAMUSCULAR | Status: AC
  Filled 2017-07-21: qty 2

## 2017-07-21 MED ORDER — SODIUM CHLORIDE 0.9 % IV SOLN
INTRAVENOUS | Status: DC | PRN
  Administered 2017-07-21: 10 mL

## 2017-07-21 MED ORDER — PROPOFOL 10 MG/ML IV BOLUS
INTRAVENOUS | Status: AC
  Filled 2017-07-21: qty 20

## 2017-07-21 MED ORDER — DEXAMETHASONE SODIUM PHOSPHATE 10 MG/ML IJ SOLN
INTRAMUSCULAR | Status: AC
  Filled 2017-07-21: qty 1

## 2017-07-21 MED ORDER — FENTANYL CITRATE (PF) 100 MCG/2ML IJ SOLN
25.0000 ug | INTRAMUSCULAR | Status: DC | PRN
  Administered 2017-07-21 (×3): 50 ug via INTRAVENOUS

## 2017-07-21 MED ORDER — BUPIVACAINE-EPINEPHRINE (PF) 0.25% -1:200000 IJ SOLN
INTRAMUSCULAR | Status: AC
  Filled 2017-07-21: qty 30

## 2017-07-21 MED ORDER — 0.9 % SODIUM CHLORIDE (POUR BTL) OPTIME
TOPICAL | Status: DC | PRN
  Administered 2017-07-21: 1000 mL

## 2017-07-21 MED ORDER — ONDANSETRON HCL 4 MG/2ML IJ SOLN
INTRAMUSCULAR | Status: AC
  Filled 2017-07-21: qty 2

## 2017-07-21 MED ORDER — OXYCODONE HCL 5 MG PO TABS
ORAL_TABLET | ORAL | Status: AC
  Filled 2017-07-21: qty 1

## 2017-07-21 MED ORDER — SODIUM CHLORIDE 0.9 % IR SOLN
Status: DC | PRN
  Administered 2017-07-21: 1000 mL

## 2017-07-21 MED ORDER — CEFAZOLIN SODIUM-DEXTROSE 2-4 GM/100ML-% IV SOLN
2.0000 g | INTRAVENOUS | Status: AC
  Administered 2017-07-21: 2 g via INTRAVENOUS
  Filled 2017-07-21: qty 100

## 2017-07-21 MED ORDER — OXYCODONE HCL 5 MG PO TABS
5.0000 mg | ORAL_TABLET | Freq: Once | ORAL | Status: AC | PRN
  Administered 2017-07-21: 5 mg via ORAL

## 2017-07-21 MED ORDER — PROPOFOL 10 MG/ML IV BOLUS
INTRAVENOUS | Status: DC | PRN
Start: 2017-07-21 — End: 2017-07-21
  Administered 2017-07-21: 190 mg via INTRAVENOUS

## 2017-07-21 MED ORDER — HEMOSTATIC AGENTS (NO CHARGE) OPTIME
TOPICAL | Status: DC | PRN
  Administered 2017-07-21: 1 via TOPICAL

## 2017-07-21 MED ORDER — FENTANYL CITRATE (PF) 100 MCG/2ML IJ SOLN
INTRAMUSCULAR | Status: AC
  Administered 2017-07-21: 50 ug via INTRAVENOUS
  Filled 2017-07-21: qty 2

## 2017-07-21 MED ORDER — FENTANYL CITRATE (PF) 100 MCG/2ML IJ SOLN
INTRAMUSCULAR | Status: DC | PRN
  Administered 2017-07-21: 50 ug via INTRAVENOUS
  Administered 2017-07-21: 100 ug via INTRAVENOUS

## 2017-07-21 MED ORDER — IOPAMIDOL (ISOVUE-300) INJECTION 61%
INTRAVENOUS | Status: AC
  Filled 2017-07-21: qty 50

## 2017-07-21 MED ORDER — DEXAMETHASONE SODIUM PHOSPHATE 10 MG/ML IJ SOLN
INTRAMUSCULAR | Status: DC | PRN
  Administered 2017-07-21: 10 mg via INTRAVENOUS

## 2017-07-21 MED ORDER — OXYCODONE HCL 5 MG PO TABS: 5.0000 mg | ORAL_TABLET | Freq: Four times a day (QID) | ORAL | 0 refills | Status: AC | PRN

## 2017-07-21 MED ORDER — PHENYLEPHRINE 40 MCG/ML (10ML) SYRINGE FOR IV PUSH (FOR BLOOD PRESSURE SUPPORT)
PREFILLED_SYRINGE | INTRAVENOUS | Status: DC | PRN
  Administered 2017-07-21 (×4): 80 ug via INTRAVENOUS

## 2017-07-21 MED ORDER — MIDAZOLAM HCL 5 MG/5ML IJ SOLN
INTRAMUSCULAR | Status: DC | PRN
  Administered 2017-07-21: 2 mg via INTRAVENOUS

## 2017-07-21 MED ORDER — ROCURONIUM BROMIDE 100 MG/10ML IV SOLN
INTRAVENOUS | Status: DC | PRN
  Administered 2017-07-21: 50 mg via INTRAVENOUS

## 2017-07-21 MED ORDER — BUPIVACAINE-EPINEPHRINE 0.25% -1:200000 IJ SOLN
INTRAMUSCULAR | Status: DC | PRN
  Administered 2017-07-21: 1 mL

## 2017-07-21 MED ORDER — ROCURONIUM BROMIDE 10 MG/ML (PF) SYRINGE
PREFILLED_SYRINGE | INTRAVENOUS | Status: AC
  Filled 2017-07-21: qty 5

## 2017-07-21 MED ORDER — OXYCODONE HCL 5 MG/5ML PO SOLN: 5.0000 mg | Freq: Once | ORAL | Status: AC | PRN

## 2017-07-21 MED ORDER — ONDANSETRON HCL 4 MG/2ML IJ SOLN
INTRAMUSCULAR | Status: DC | PRN
  Administered 2017-07-21: 4 mg via INTRAVENOUS

## 2017-07-21 MED ORDER — SUGAMMADEX SODIUM 200 MG/2ML IV SOLN
INTRAVENOUS | Status: AC
  Filled 2017-07-21: qty 2

## 2017-07-21 MED ORDER — FENTANYL CITRATE (PF) 100 MCG/2ML IJ SOLN
INTRAMUSCULAR | Status: AC
  Filled 2017-07-21: qty 2

## 2017-07-21 MED ORDER — PHENYLEPHRINE 40 MCG/ML (10ML) SYRINGE FOR IV PUSH (FOR BLOOD PRESSURE SUPPORT)
PREFILLED_SYRINGE | INTRAVENOUS | Status: AC
  Filled 2017-07-21: qty 10

## 2017-07-21 MED ORDER — ONDANSETRON HCL 4 MG/2ML IJ SOLN: 4.0000 mg | Freq: Once | INTRAMUSCULAR | Status: DC | PRN

## 2017-07-21 MED ORDER — FENTANYL CITRATE (PF) 250 MCG/5ML IJ SOLN
INTRAMUSCULAR | Status: AC
  Filled 2017-07-21: qty 5

## 2017-07-21 SURGICAL SUPPLY — 42 items
APPLIER CLIP ROT 10 11.4 M/L (STAPLE) ×3
BENZOIN TINCTURE PRP APPL 2/3 (GAUZE/BANDAGES/DRESSINGS) ×3 IMPLANT
BLADE CLIPPER SURG (BLADE) IMPLANT
CANISTER SUCT 3000ML PPV (MISCELLANEOUS) ×3 IMPLANT
CHLORAPREP W/TINT 26ML (MISCELLANEOUS) ×3 IMPLANT
CLIP APPLIE ROT 10 11.4 M/L (STAPLE) ×1 IMPLANT
CLOSURE WOUND 1/2 X4 (GAUZE/BANDAGES/DRESSINGS) ×1
COVER MAYO STAND STRL (DRAPES) ×3 IMPLANT
COVER SURGICAL LIGHT HANDLE (MISCELLANEOUS) ×3 IMPLANT
DRAPE C-ARM 42X72 X-RAY (DRAPES) ×3 IMPLANT
DRSG TEGADERM 2-3/8X2-3/4 SM (GAUZE/BANDAGES/DRESSINGS) ×9 IMPLANT
DRSG TEGADERM 4X4.75 (GAUZE/BANDAGES/DRESSINGS) ×3 IMPLANT
ELECT REM PT RETURN 9FT ADLT (ELECTROSURGICAL) ×3
ELECTRODE REM PT RTRN 9FT ADLT (ELECTROSURGICAL) ×1 IMPLANT
FILTER SMOKE EVAC LAPAROSHD (FILTER) ×3 IMPLANT
GAUZE SPONGE 2X2 8PLY STRL LF (GAUZE/BANDAGES/DRESSINGS) ×1 IMPLANT
GLOVE BIO SURGEON STRL SZ7 (GLOVE) ×3 IMPLANT
GLOVE BIOGEL PI IND STRL 7.5 (GLOVE) ×1 IMPLANT
GLOVE BIOGEL PI INDICATOR 7.5 (GLOVE) ×2
GOWN STRL REUS W/ TWL LRG LVL3 (GOWN DISPOSABLE) ×3 IMPLANT
GOWN STRL REUS W/TWL LRG LVL3 (GOWN DISPOSABLE) ×6
KIT BASIN OR (CUSTOM PROCEDURE TRAY) ×3 IMPLANT
KIT ROOM TURNOVER OR (KITS) ×3 IMPLANT
NS IRRIG 1000ML POUR BTL (IV SOLUTION) ×3 IMPLANT
PAD ARMBOARD 7.5X6 YLW CONV (MISCELLANEOUS) ×3 IMPLANT
POUCH SPECIMEN RETRIEVAL 10MM (ENDOMECHANICALS) ×3 IMPLANT
SCISSORS LAP 5X35 DISP (ENDOMECHANICALS) ×3 IMPLANT
SET CHOLANGIOGRAPH 5 50 .035 (SET/KITS/TRAYS/PACK) ×3 IMPLANT
SET IRRIG TUBING LAPAROSCOPIC (IRRIGATION / IRRIGATOR) ×3 IMPLANT
SLEEVE ENDOPATH XCEL 5M (ENDOMECHANICALS) ×3 IMPLANT
SPECIMEN JAR SMALL (MISCELLANEOUS) ×3 IMPLANT
SPONGE GAUZE 2X2 STER 10/PKG (GAUZE/BANDAGES/DRESSINGS) ×2
STRIP CLOSURE SKIN 1/2X4 (GAUZE/BANDAGES/DRESSINGS) ×2 IMPLANT
SURGICEL SNOW 4INWX4INL (HEMOSTASIS) ×3 IMPLANT
SUT MNCRL AB 4-0 PS2 18 (SUTURE) ×3 IMPLANT
TOWEL OR 17X24 6PK STRL BLUE (TOWEL DISPOSABLE) ×3 IMPLANT
TOWEL OR 17X26 10 PK STRL BLUE (TOWEL DISPOSABLE) ×3 IMPLANT
TRAY LAPAROSCOPIC MC (CUSTOM PROCEDURE TRAY) ×3 IMPLANT
TROCAR XCEL BLUNT TIP 100MML (ENDOMECHANICALS) ×3 IMPLANT
TROCAR XCEL NON-BLD 11X100MML (ENDOMECHANICALS) ×3 IMPLANT
TROCAR XCEL NON-BLD 5MMX100MML (ENDOMECHANICALS) ×3 IMPLANT
TUBING INSUFFLATION (TUBING) ×3 IMPLANT

## 2017-07-21 NOTE — Interval H&P Note (Signed)
History and Physical Interval Note:  07/21/2017 10:23 AM  Gary Colon  has presented today for surgery, with the diagnosis of Chronic calculus cholecystitis  The various methods of treatment have been discussed with the patient and family. After consideration of risks, benefits and other options for treatment, the patient has consented to  Procedure(s): LAPAROSCOPIC CHOLECYSTECTOMY WITH INTRAOPERATIVE CHOLANGIOGRAM (N/A) as a surgical intervention .  The patient's history has been reviewed, patient examined, no change in status, stable for surgery.  I have reviewed the patient's chart and labs.  Questions were answered to the patient's satisfaction.     Alga Southall K.

## 2017-07-21 NOTE — H&P (Signed)
History of Present Illness Gary Colon. Gary Kingsley MD; 07/17/2017 9:53 AM) The patient is a 56 year old male who presents for evaluation of gall stones.   Referred by Dr. Leodis Sias for gallstones  This is a 56 year old male in good health who presents with a one-month history of intermittent epigastric and right upper quadrant abdominal pain. Initially this was exacerbated by eating greasy meals. However his symptoms have worsened to the point that anytime he tries to eat he developed abdominal pain. He also has associated nausea, vomiting, diarrhea, and abdominal bloating. He was evaluated with an ultrasound as well as blood work. Ultrasound showed multiple gallstones but no sign of acute cholecystitis. Liver function tests were normal. He presents now to discuss surgical intervention.  CLINICAL DATA: Right upper quadrant pain for 1 month.  EXAM: ABDOMEN ULTRASOUND COMPLETE  COMPARISON: None.  FINDINGS: Gallbladder: Multiple stones within the gallbladder, largest measured stone measuring 7 mm. Probable additional tumefactive sludge. No gallbladder wall thickening, pericholecystic fluid or other secondary signs of acute cholecystitis.  Common bile duct: Diameter: Normal at 4 mm  Liver: Diffusely increased echogenicity suggesting fatty infiltration. Echogenic mass within the left liver lobe measures 2.2 cm. Portal vein is patent on color Doppler imaging with normal direction of blood flow towards the liver.  IVC: No abnormality visualized.  Pancreas: Visualized portion unremarkable. Only pancreatic head is visualized.  Spleen: Size and appearance within normal limits.  Right Kidney: Length: 11.2 cm. Echogenicity within normal limits. No mass or hydronephrosis visualized.  Left Kidney: Length: 11.3 cm. Echogenicity within normal limits. No mass or hydronephrosis visualized.  Abdominal aorta: No aneurysm visualized.  Other findings: None.  IMPRESSION: 1.  Cholelithiasis without evidence of acute cholecystitis. 2. Echogenic mass within the left liver lobe measuring 2.2 cm. 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 3. Probable fatty infiltration of the liver.   Electronically Signed By: Bary Richard M.D. On: 07/10/2017 13:26  Lipase - 21 LFT's WNL WBC - 13.1 Hgb 14.6    Past Surgical History Gary Colon, RMA; 07/17/2017 9:32 AM) Oral Surgery  Vasectomy   Diagnostic Studies History Gary Colon, [place]; 07/17/2017 9:32 AM) Colonoscopy  1-5 years ago  Allergies Gary Colon, RMA; 07/17/2017 9:33 AM) No Known Allergies 07/17/2017  Medication History Gary Colon, RMA; 07/17/2017 9:33 AM) No Current Medications Medications Reconciled  Social History Gary Colon, RMA; 07/17/2017 9:32 AM) Alcohol use  Remotely quit alcohol use. No caffeine use  No drug use  Tobacco use  Never smoker.  Family History Gary Colon, [place]; 07/17/2017 9:32 AM) Hypertension  Mother.  Other Problems Gary Colon, [place]; 07/17/2017 9:32 AM) Cholelithiasis     Review of Systems Gary Colon RMA; 07/17/2017 9:32 AM) General Present- Appetite Loss. Not Present- Chills, Fatigue, Fever, Night Sweats, Weight Gain and Weight Loss. Skin Not Present- Change in Wart/Mole, Dryness, Hives, Jaundice, New Lesions, Non-Healing Wounds, Rash and Ulcer. HEENT Not Present- Earache, Hearing Loss, Hoarseness, Nose Bleed, Oral Ulcers, Ringing in the Ears, Seasonal Allergies, Sinus Pain, Sore Throat, Visual Disturbances, Wears glasses/contact lenses and Yellow Eyes. Respiratory Not Present- Bloody sputum, Chronic Cough, Difficulty Breathing, Snoring and Wheezing. Cardiovascular Not Present- Chest Pain, Difficulty Breathing Lying Down, Leg Cramps, Palpitations, Rapid Heart Rate, Shortness of Breath and Swelling of Extremities. Gastrointestinal Present-  Abdominal Pain, Bloating and Nausea. Not Present- Bloody Stool, Change in Bowel Habits, Chronic diarrhea, Constipation, Difficulty Swallowing, Excessive gas, Gets full quickly at meals, Hemorrhoids, Indigestion, Rectal Pain and Vomiting.  Male Genitourinary Not Present- Blood in Urine, Change in Urinary Stream, Frequency, Impotence, Nocturia, Painful Urination, Urgency and Urine Leakage. Musculoskeletal Not Present- Back Pain, Joint Pain, Joint Stiffness, Muscle Pain, Muscle Weakness and Swelling of Extremities. Neurological Not Present- Decreased Memory, Fainting, Headaches, Numbness, Seizures, Tingling, Tremor, Trouble walking and Weakness. Psychiatric Not Present- Anxiety, Bipolar, Change in Sleep Pattern, Depression, Fearful and Frequent crying. Hematology Not Present- Blood Thinners, Easy Bruising, Excessive bleeding, Gland problems, HIV and Persistent Infections.  Vitals Gary Colon RMA; 07/17/2017 9:34 AM) 07/17/2017 9:33 AM Weight: 208.6 lb Height: 73in Body Surface Area: 2.19 m Body Mass Index: 27.52 kg/m  Temp.: 98.67F  Pulse: 66 (Regular)  BP: 130/86 (Sitting, Left Arm, Standard)   Physical Exam [name] Ferol Laiche MD; 07/17/2017 9:54 AM) The physical exam findings are as follows: Note:WDWN in NAD Eyes: Pupils equal, round; sclera anicteric HENT: Oral mucosa moist; good dentition Neck: No masses palpated, no thyromegaly Lungs: CTA bilaterally; normal respiratory effort CV: Regular rate and rhythm; no murmurs; extremities well-perfused with no edema Abd: +bowel sounds, soft, mildly tender in RUQ/ epigastrium, no palpable organomegaly; no palpable hernias Skin: Warm, dry; no sign of jaundice Psychiatric - alert and oriented x 4; calm mood and affect    Assessment & Plan [name] [name] MD; 07/17/2017 9:51 AM) CHRONIC CHOLECYSTITIS WITH CALCULUS (K80.10) Current Plans Schedule for Surgery - Laparoscopic cholecystectomy with intraoperative cholangiogram. The  surgical procedure has been discussed with the patient. Potential risks, benefits, alternative treatments, and expected outcomes have been explained. All of the patient's questions at this time have been answered. The likelihood of reaching the patient's treatment goal is good. The patient understand the proposed surgical procedure and wishes to proceed.  Gary Colon. Corliss Skains, MD, [facility]  General/ Trauma Surgery  07/21/2017 7:42 AM

## 2017-07-21 NOTE — Anesthesia Procedure Notes (Signed)
Procedure Name: Intubation Date/Time: 07/21/2017 10:40 AM Performed by: Kyung Rudd Pre-anesthesia Checklist: Patient identified, Emergency Drugs available, Suction available and Patient being monitored Patient Re-evaluated:Patient Re-evaluated prior to induction Oxygen Delivery Method: Circle system utilized Preoxygenation: Pre-oxygenation with 100% oxygen Induction Type: IV induction Ventilation: Mask ventilation without difficulty and Oral airway inserted - appropriate to patient size Laryngoscope Size: Mac and 4 Grade View: Grade I Tube type: Oral Tube size: 7.5 mm Number of attempts: 1 Airway Equipment and Method: Stylet Placement Confirmation: ETT inserted through vocal cords under direct vision,  positive ETCO2 and breath sounds checked- equal and bilateral Secured at: 21 cm Tube secured with: Tape Dental Injury: Teeth and Oropharynx as per pre-operative assessment

## 2017-07-21 NOTE — Discharge Instructions (Signed)
[facility] ______[facility] °LAPAROSCOPIC SURGERY: POST OP INSTRUCTIONS °Always review your discharge instruction sheet given to you by the facility where your surgery was performed. °IF YOU HAVE DISABILITY OR FAMILY LEAVE FORMS, YOU MUST BRING THEM TO THE OFFICE FOR PROCESSING.   °DO NOT GIVE THEM TO YOUR DOCTOR. ° °1. A prescription for pain medication may be given to you upon discharge.  Take your pain medication as prescribed, if needed.  If narcotic pain medicine is not needed, then you may take acetaminophen (Tylenol) or ibuprofen (Advil) as needed. °2. Take your usually prescribed medications unless otherwise directed. °3. If you need a refill on your pain medication, please contact your pharmacy.  They will contact our office to request authorization. Prescriptions will not be filled after 5pm or on week-ends. °4. You should follow a light diet the first few days after arrival home, such as soup and crackers, etc.  Be sure to include lots of fluids daily. °5. Most patients will experience some swelling and bruising in the area of the incisions.  Ice packs will help.  Swelling and bruising can take several days to resolve.  °6. It is common to experience some constipation if taking pain medication after surgery.  Increasing fluid intake and taking a stool softener (such as Colace) will usually help or prevent this problem from occurring.  A mild laxative (Milk of Magnesia or Miralax) should be taken according to package instructions if there are no bowel movements after 48 hours. °7. Unless discharge instructions indicate otherwise, you may remove your bandages 24-48 hours after surgery, and you may shower at that time.  You may have steri-strips (small skin tapes) in place directly over the incision.  These strips should be left on the skin for 7-10 days.  If your surgeon used skin glue on the incision, you may shower in 24 hours.  The glue will flake off over the next 2-3 weeks.  Any sutures or  staples will be removed at the office during your follow-up visit. °8. ACTIVITIES:  You may resume regular (light) daily activities beginning the next day--such as daily self-care, walking, climbing stairs--gradually increasing activities as tolerated.  You may have sexual intercourse when it is comfortable.  Refrain from any heavy lifting or straining until approved by your doctor. °a. You may drive when you are no longer taking prescription pain medication, you can comfortably wear a seatbelt, and you can safely maneuver your car and apply brakes. °b. RETURN TO WORK:  __________________________________________________________ °9. You should see your doctor in the office for a follow-up appointment approximately 2-3 weeks after your surgery.  Make sure that you call for this appointment within a day or two after you arrive home to insure a convenient appointment time. °10. OTHER INSTRUCTIONS: __________________________________________________________________________________________________________________________ __________________________________________________________________________________________________________________________ °WHEN TO CALL YOUR DOCTOR: °1. Fever over 101.0 °2. Inability to urinate °3. Continued bleeding from incision. °4. Increased pain, redness, or drainage from the incision. °5. Increasing abdominal pain ° °The clinic staff is available to answer your questions during regular business hours.  Please don’t hesitate to call and ask to speak to one of the nurses for clinical concerns.  If you have a medical emergency, go to the nearest emergency room or call 911.  A surgeon from [facility] is always on call at the hospital. °[address], [address], [place], [place]  [address] ? [address] [address], [place], [place]   [address] °[phone] ? [phone] ? FAX [phone] °Web site:   [email or web address] °

## 2017-07-21 NOTE — Op Note (Signed)
Laparoscopic Cholecystectomy with IOC Procedure Note  Indications: This patient presents with symptomatic gallbladder disease and will undergo laparoscopic cholecystectomy.  Pre-operative Diagnosis: Calculus of gallbladder with other cholecystitis, without mention of obstruction  Post-operative Diagnosis: Same  Surgeon: Evett Kassa K.   Assistants: none  Anesthesia: General endotracheal anesthesia  ASA Class: 2  Procedure Details  The patient was seen again in the Holding Room. The risks, benefits, complications, treatment options, and expected outcomes were discussed with the patient. The possibilities of reaction to medication, pulmonary aspiration, perforation of viscus, bleeding, recurrent infection, finding a normal gallbladder, the need for additional procedures, failure to diagnose a condition, the possible need to convert to an open procedure, and creating a complication requiring transfusion or operation were discussed with the patient. The likelihood of improving the patient's symptoms with return to their baseline status is good.  The patient and/or family concurred with the proposed plan, giving informed consent. The site of surgery properly noted. The patient was taken to Operating Room, identified as Gary Colon and the procedure verified as Laparoscopic Cholecystectomy with Intraoperative Cholangiogram. A Time Out was held and the above information confirmed.  Prior to the induction of general anesthesia, antibiotic prophylaxis was administered. General endotracheal anesthesia was then administered and tolerated well. After the induction, the abdomen was prepped with Chloraprep and draped in the sterile fashion. The patient was positioned in the supine position.  Local anesthetic agent was injected into the skin near the umbilicus and an incision made. We dissected down to the abdominal fascia with blunt dissection.  The fascia was incised vertically and we entered the  peritoneal cavity bluntly.  A pursestring suture of 0-Vicryl was placed around the fascial opening.  The Hasson cannula was inserted and secured with the stay suture.  Pneumoperitoneum was then created with CO2 and tolerated well without any adverse changes in the patient's vital signs. An 11-mm port was placed in the subxiphoid position.  Two 5-mm ports were placed in the right upper quadrant. All skin incisions were infiltrated with a local anesthetic agent before making the incision and placing the trocars.   We positioned the patient in reverse Trendelenburg, tilted slightly to the patient's left.  The gallbladder was identified, the fundus grasped and retracted cephalad. The gallbladder is quite distended and chronically thickened.  There was no sign of acute infection.  Adhesions were lysed bluntly and with the electrocautery where indicated, taking care not to injure any adjacent organs or viscus. The infundibulum was grasped and retracted laterally, exposing the peritoneum overlying the triangle of Calot. This was then divided and exposed in a blunt fashion. A critical view of the cystic duct and cystic artery was obtained.  The cystic duct was clearly identified and bluntly dissected circumferentially. The cystic duct was ligated with a clip distally.   The cystic duct is fairly short.  An incision was made in the cystic duct and the [facility] cholangiogram catheter introduced. The catheter was secured using a clip. A cholangiogram was then obtained which showed good visualization of the distal and proximal biliary tree with no sign of filling defects or obstruction.  Contrast flowed easily into the duodenum. The catheter was then removed.   The cystic duct was then ligated with clips and divided. The cystic artery was identified, dissected free, ligated with clips and divided as well.   The gallbladder was dissected from the liver bed in retrograde fashion with the electrocautery. The gallbladder was  removed and placed in an Endocatch sac.  The liver bed was irrigated and inspected. Hemostasis was achieved with the electrocautery and Surgicel SNOW. Copious irrigation was utilized and was repeatedly aspirated until clear.  The gallbladder and Endocatch sac were then removed through the umbilical port site.  The pursestring suture was used to close the umbilical fascia.    We again inspected the right upper quadrant for hemostasis.  Pneumoperitoneum was released as we removed the trocars.  4-0 Monocryl was used to close the skin.   Benzoin, steri-strips, and clean dressings were applied. The patient was then extubated and brought to the recovery room in stable condition. Instrument, sponge, and needle counts were correct at closure and at the conclusion of the case.   Findings: Cholecystitis with Cholelithiasis  Estimated Blood Loss: less than 50 mL         Drains: none         Specimens: Gallbladder           Complications: None; patient tolerated the procedure well.         Disposition: PACU - hemodynamically stable.         Condition: stable  Wilmon Arms. Corliss Skains, MD, [facility]  General/ Trauma Surgery  07/21/2017 12:01 PM

## 2017-07-21 NOTE — Anesthesia Postprocedure Evaluation (Signed)
Anesthesia Post Note  Patient: Gary Colon  Procedure(s) Performed: Procedure(s) (LRB): LAPAROSCOPIC CHOLECYSTECTOMY WITH INTRAOPERATIVE CHOLANGIOGRAM (N/A)     Patient location during evaluation: PACU Anesthesia Type: General Level of consciousness: awake, awake and alert and oriented Pain management: pain level controlled Vital Signs Assessment: post-procedure vital signs reviewed and stable Respiratory status: spontaneous breathing, nonlabored ventilation and respiratory function stable Cardiovascular status: blood pressure returned to baseline Anesthetic complications: no    Last Vitals:  Vitals:   07/21/17 1330 07/21/17 1345  BP: 134/84   Pulse: 71   Resp: 12   Temp:  36.7 C  SpO2: 100%     Last Pain:  Vitals:   07/21/17 1348  TempSrc:   PainSc: 0-No pain                 Annikah Lovins COKER

## 2017-07-21 NOTE — Transfer of Care (Signed)
Immediate Anesthesia Transfer of Care Note  Patient: Gary Colon  Procedure(s) Performed: Procedure(s): LAPAROSCOPIC CHOLECYSTECTOMY WITH INTRAOPERATIVE CHOLANGIOGRAM (N/A)  Patient Location: PACU  Anesthesia Type:General  Level of Consciousness: awake, alert  and oriented  Airway & Oxygen Therapy: Patient Spontanous Breathing and Patient connected to nasal cannula oxygen  Post-op Assessment: Report given to RN, Post -op Vital signs reviewed and stable and Patient moving all extremities  Post vital signs: Reviewed and stable  Last Vitals:  Vitals:   07/21/17 0945 07/21/17 1216  BP: (!) 156/71 131/77  Pulse: 85 73  Resp: 18 14  Temp: 36.8 C (!) 36.2 C  SpO2: 96% 100%    Last Pain:  Vitals:   07/21/17 1216  TempSrc:   PainSc: Asleep      Patients Stated Pain Goal: 1 (07/21/17 0927)  Complications: No apparent anesthesia complications

## 2017-07-21 NOTE — Anesthesia Preprocedure Evaluation (Addendum)

## 2017-07-22 ENCOUNTER — Encounter (HOSPITAL_COMMUNITY): Payer: Self-pay | Admitting: Surgery

## 2017-11-06 IMAGING — US US SCROTUM
1 series · 14 of 25 positions shown · non-contrast
Comparison: No recent prior .

CLINICAL DATA: Right scrotal lump for 12 years.

EXAM:
SCROTAL ULTRASOUND
DOPPLER ULTRASOUND OF THE TESTICLES
TECHNIQUE: Complete ultrasound examination of the testicles, epididymis, and
other scrotal structures was performed. Color and spectral Doppler
ultrasound were also utilized to evaluate blood flow to the
testicles.

[Series 1: us scrotum · 14 of 41 slices shown]
[im 1/41]
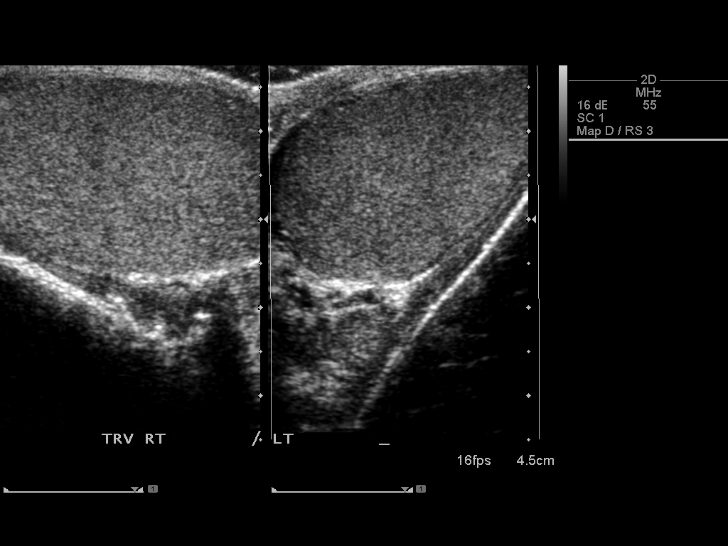
[im 4/41]
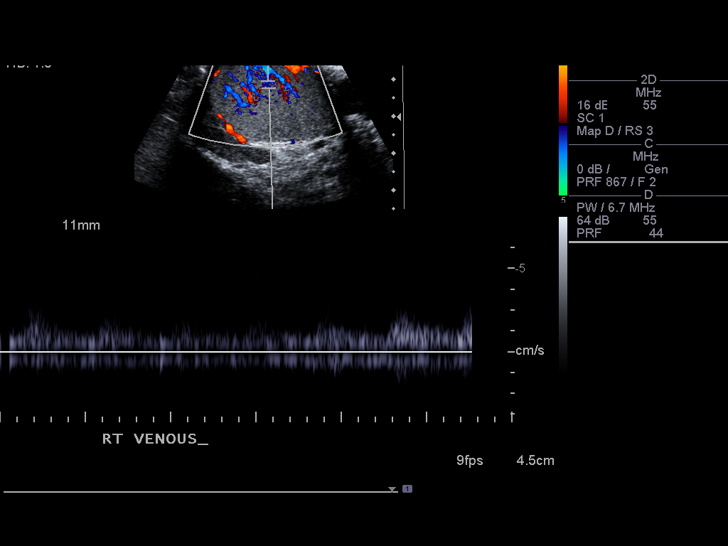
[im 7/41]
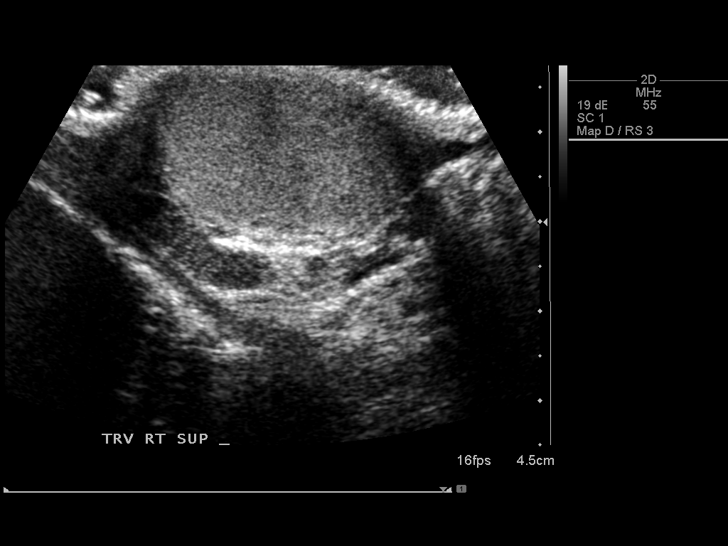
[im 11/41]
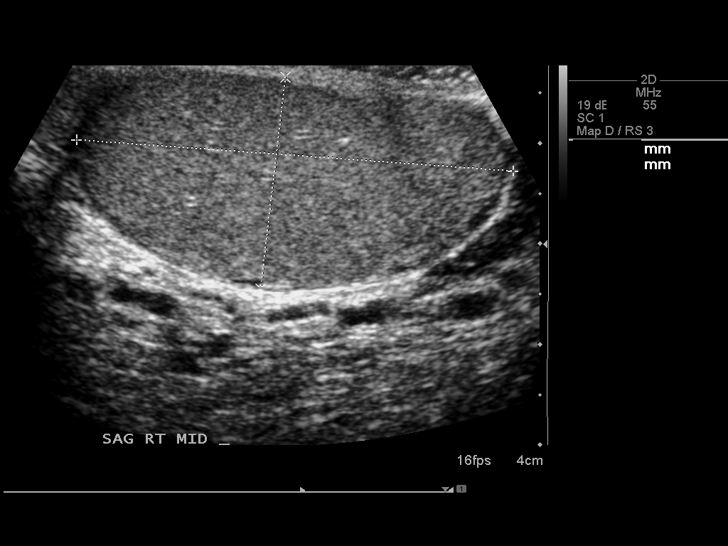
[im 14/41]
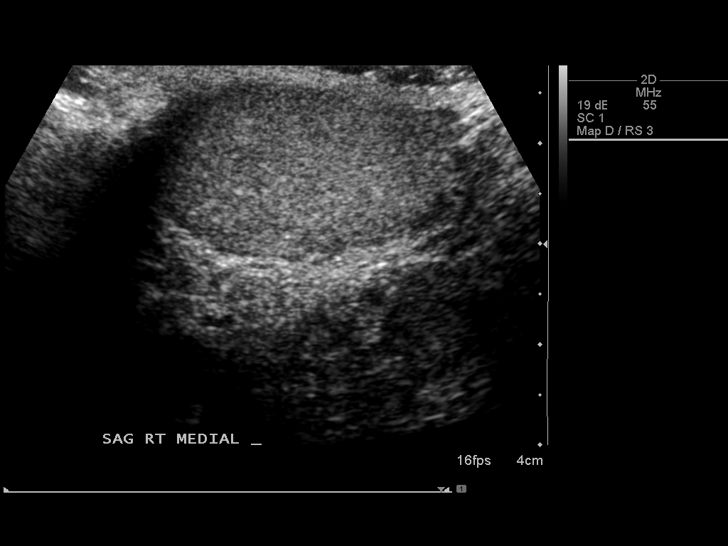
[im 16/41]
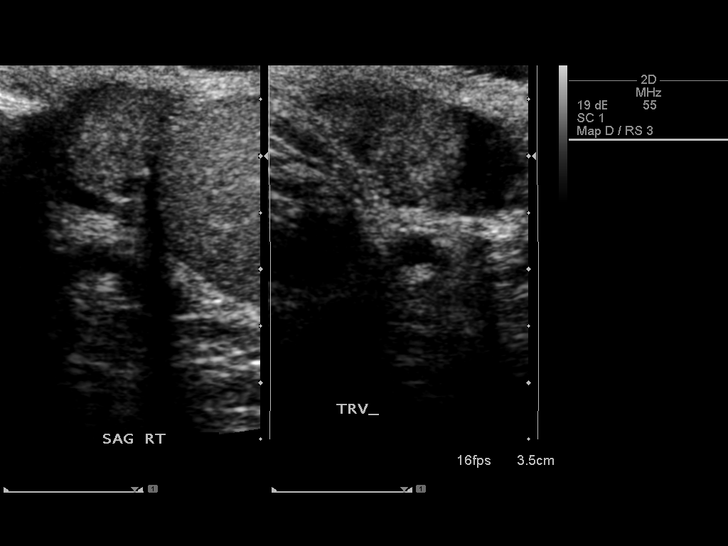
[im 19/41]
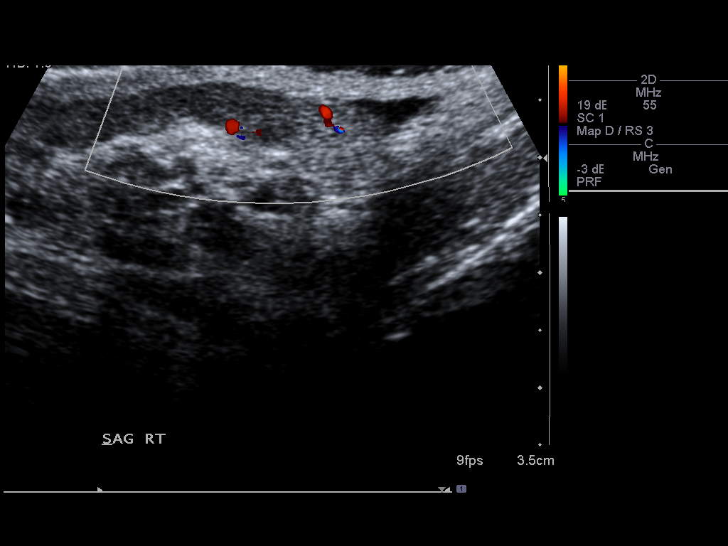
[im 22/41]
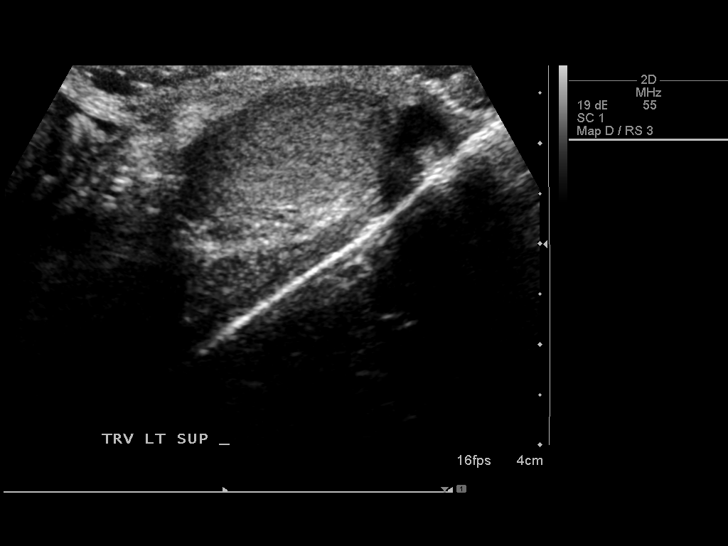
[im 26/41]
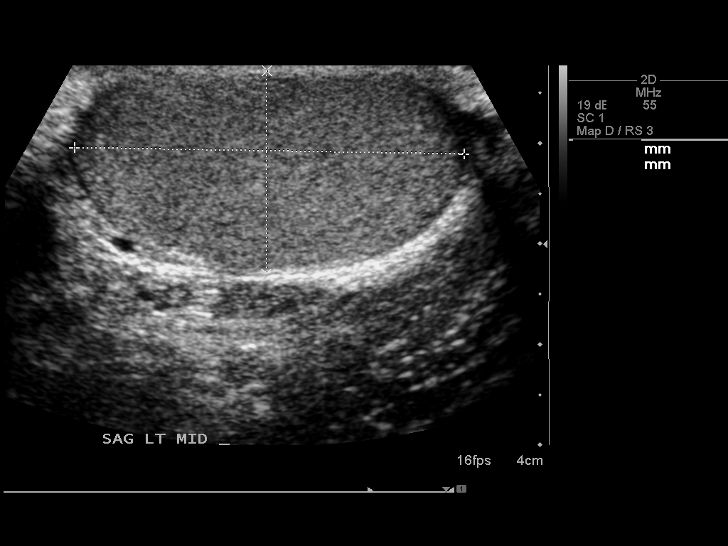
[im 27/41]
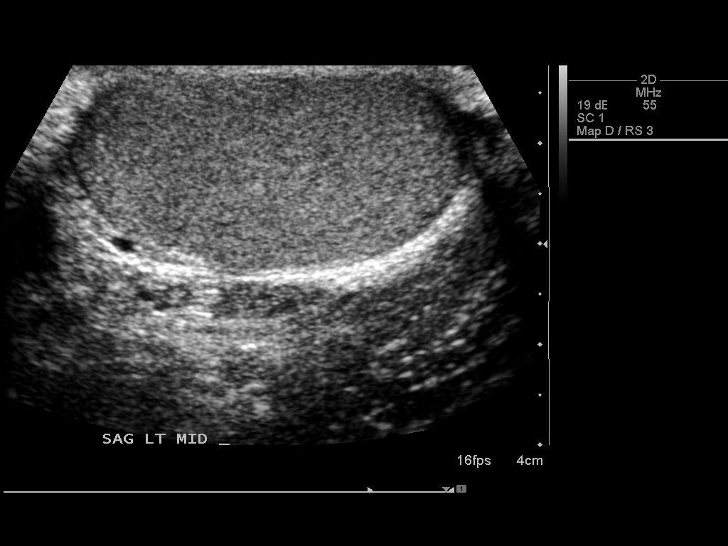
[im 31/41]
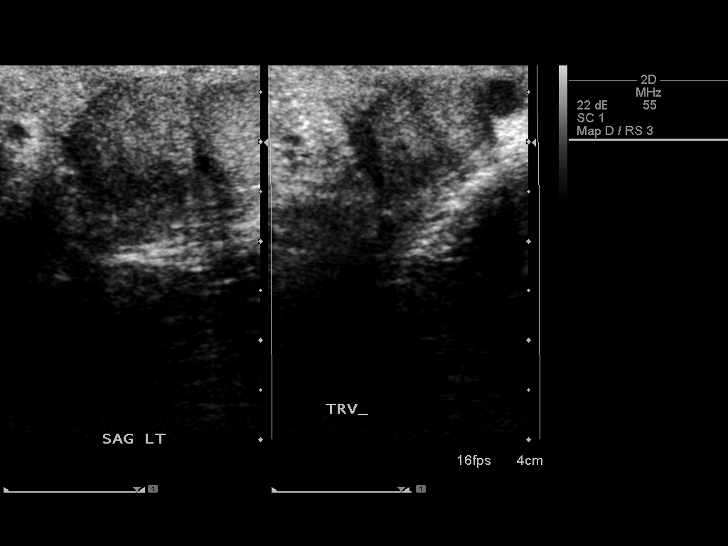
[im 34/41]
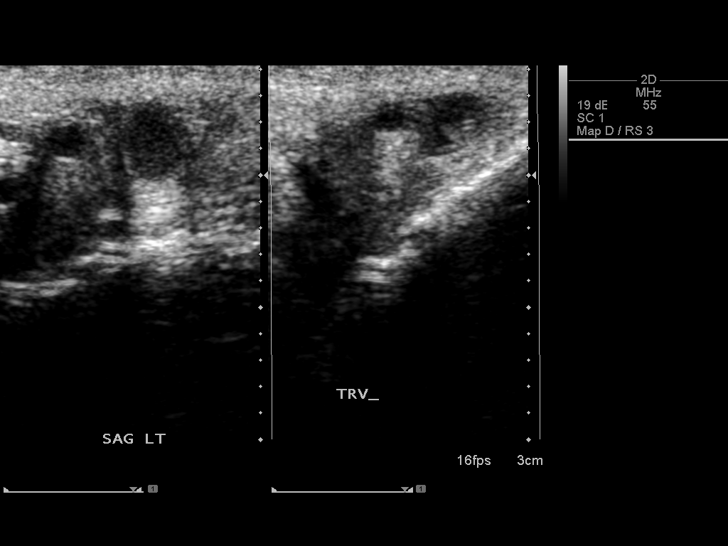
[im 37/41]
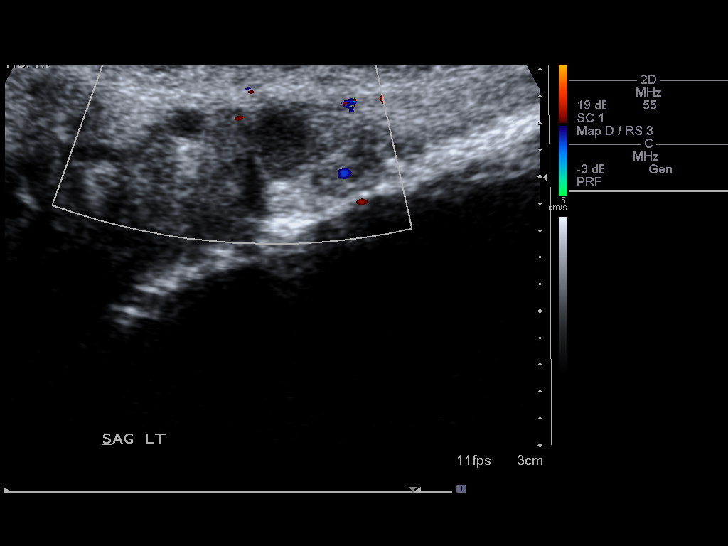
[im 41/41]
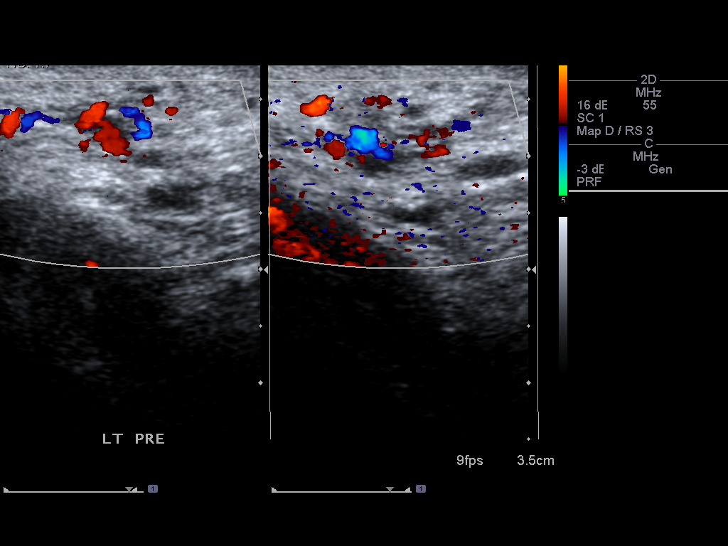

[14 of 25 positions shown; findings below may reference images not displayed]

FINDINGS: Right testicle

Measurements: 4.4 x 2.1 x 3.6 cm. No mass or microlithiasis
visualized.

Left testicle

Measurements: 3.9 x 2.0 x 3.1 cm. No mass or microlithiasis
visualized.

Right epididymis:  Normal in size and appearance.

Left epididymis: 3 mm and adjacent 6 mm cysts are noted. These may
have internal debris.

Hydrocele:  None visualized.

Varicocele:  None visualized.

Pulsed Doppler interrogation of both testes demonstrates normal low
resistance arterial and venous waveforms bilaterally.
IMPRESSION: 1. Two small left epididymal cyst.

2. Exam otherwise normal. No evidence of testicular mass. No
evidence of torsion.

## 2018-10-19 IMAGING — US US ABDOMEN COMPLETE
2 series · 13 of 25 positions shown · non-contrast
Comparison: None.

CLINICAL DATA: Right upper quadrant pain for 1 month.

EXAM:
ABDOMEN ULTRASOUND COMPLETE

[Series 1: us abdomen complete · 0.23mm/px · 12 of 95 slices shown (1 of 2)]
[im 1/95]
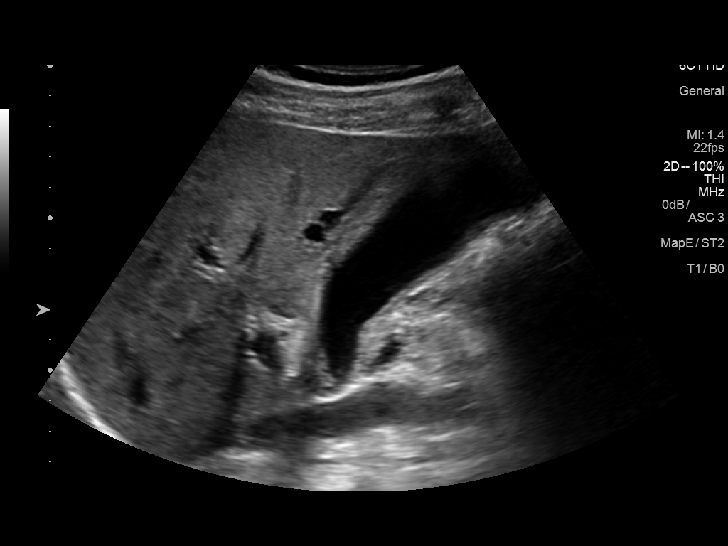
[im 9/95]
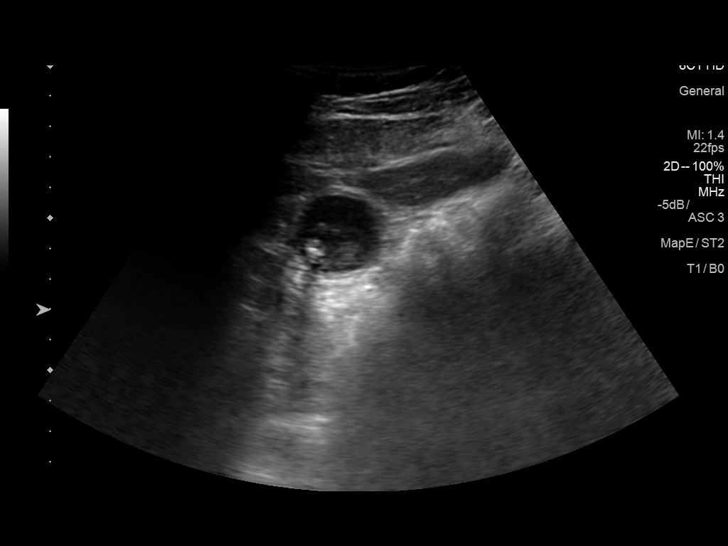
[im 17/95]
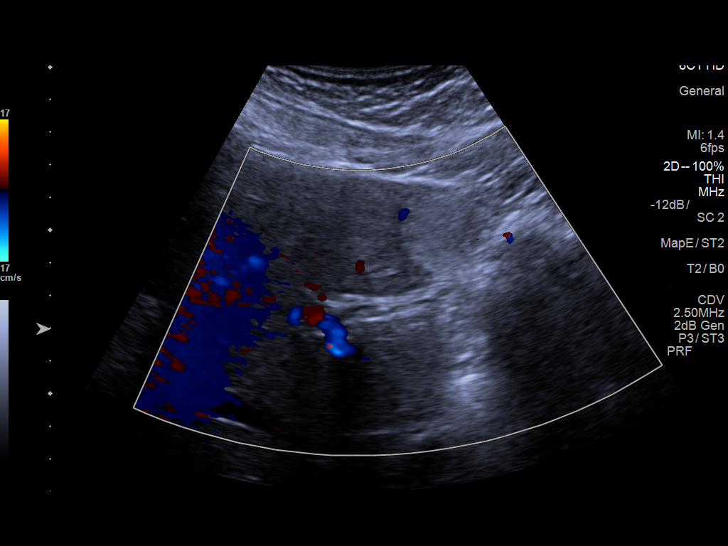
[im 25/95]
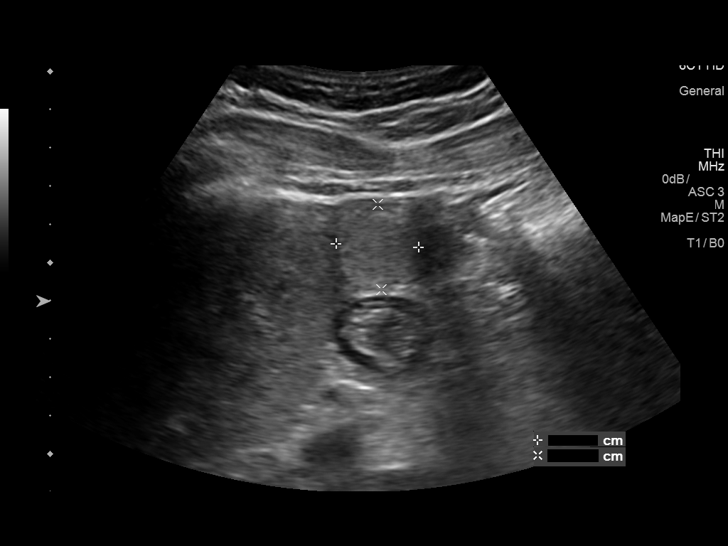
[im 33/95]
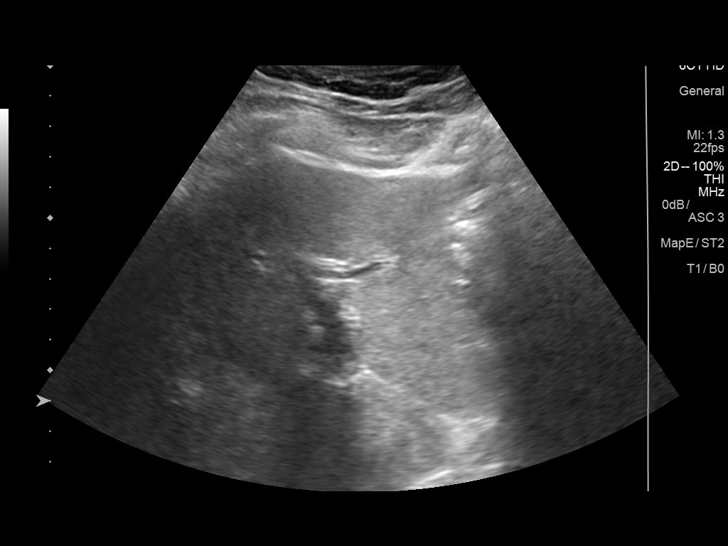
[im 41/95]
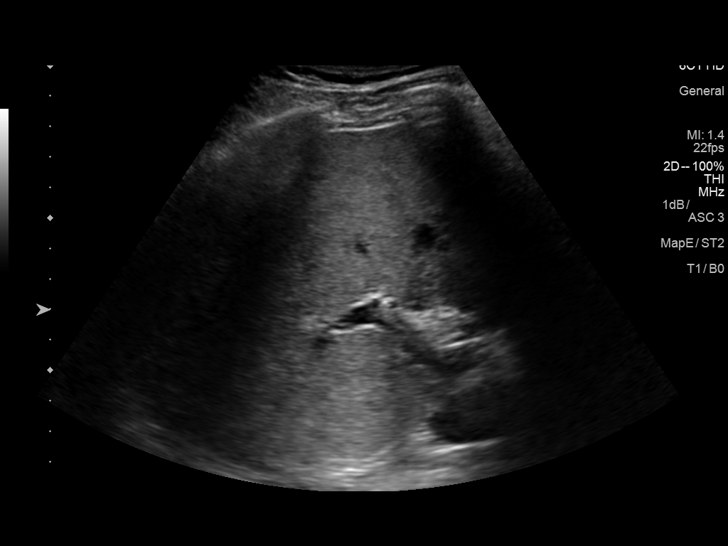
[im 50/95]
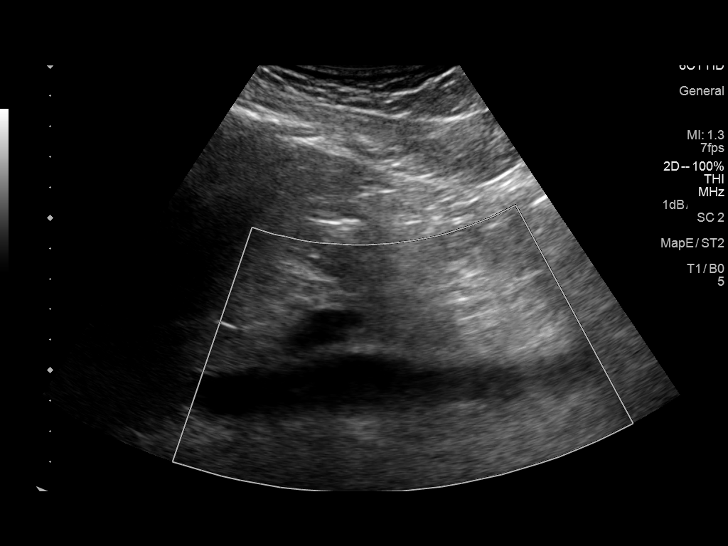
[im 58/95]
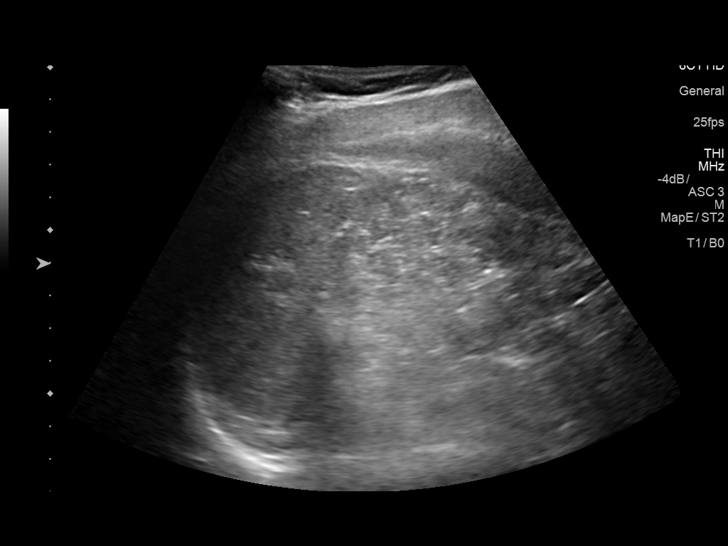
[im 66/95]
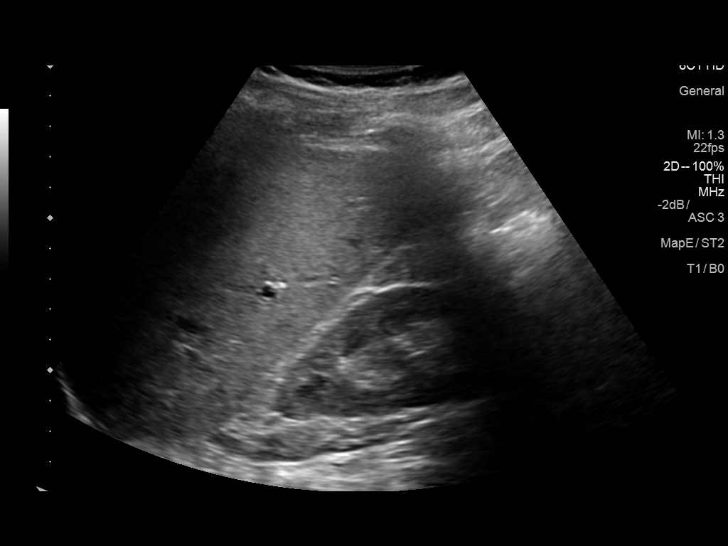
[im 74/95]
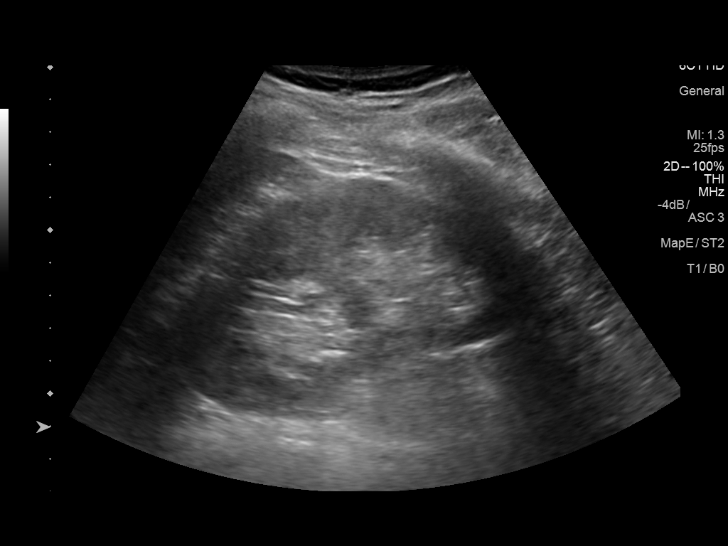
[im 82/95]
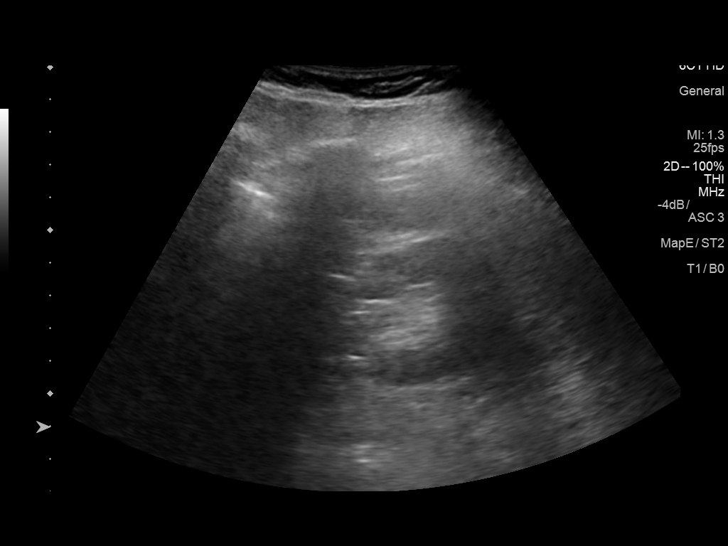
[im 90/95]
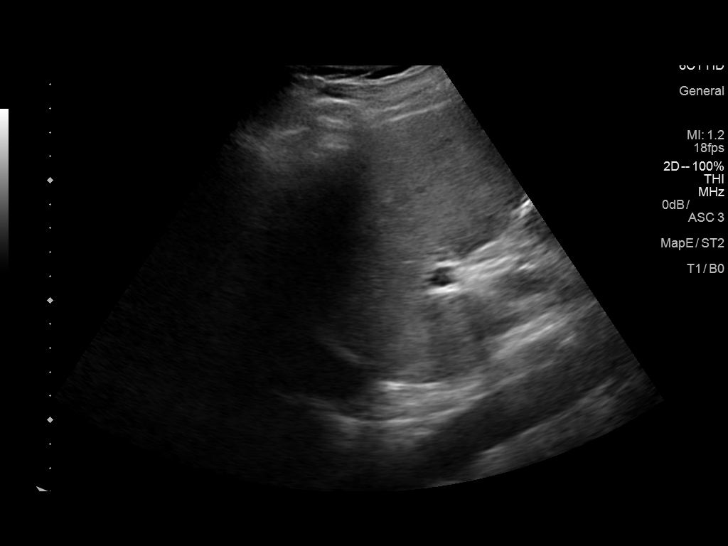

[Series 2001: us abdomen complete · 0.23mm/px · 1 of 1 slices shown (2 of 2)]
[im 1/1]
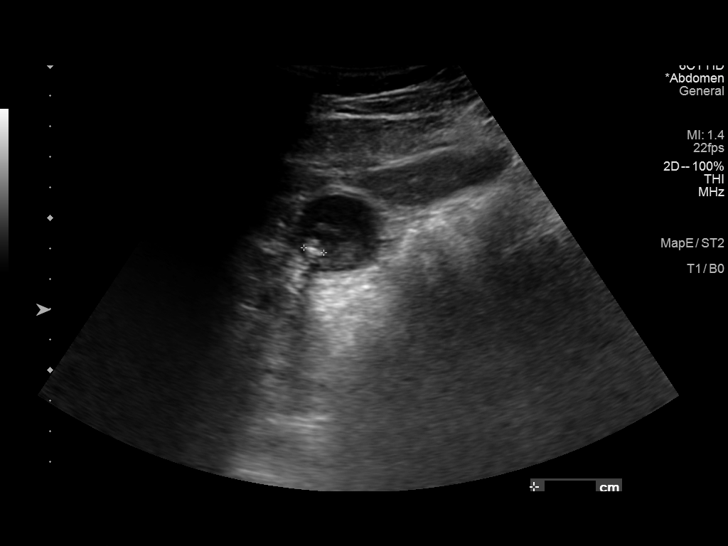

[13 of 25 positions shown; findings below may reference images not displayed]

FINDINGS: Gallbladder: Multiple stones within the gallbladder, largest
measured stone measuring 7 mm. Probable additional tumefactive
sludge. No gallbladder wall thickening, pericholecystic fluid or
other secondary signs of acute cholecystitis.

Common bile duct: Diameter: Normal at 4 mm

Liver: Diffusely increased echogenicity suggesting fatty
infiltration. Echogenic mass within the left liver lobe measures
cm. Portal vein is patent on color Doppler imaging with normal
direction of blood flow towards the liver.

IVC: No abnormality visualized.

Pancreas: Visualized portion unremarkable. Only pancreatic head is
visualized.

Spleen: Size and appearance within normal limits.

Right Kidney: Length: 11.2 cm. Echogenicity within normal limits. No
mass or hydronephrosis visualized.

Left Kidney: Length: 11.3 cm. Echogenicity within normal limits. No
mass or hydronephrosis visualized.

Abdominal aorta: No aneurysm visualized.

Other findings: None.
IMPRESSION: 1. Cholelithiasis without evidence of acute cholecystitis.
2. Echogenic mass within the left liver lobe measuring 2.2 cm. This
may represent a benign angiomyolipoma, however, nonemergent liver
MRI is recommended to confirm benignity.
3. Probable fatty infiltration of the liver.

## 2020-01-27 ENCOUNTER — Ambulatory Visit: Payer: BC Managed Care – PPO | Attending: Internal Medicine

## 2020-01-27 DIAGNOSIS — Z23 Encounter for immunization: Secondary | ICD-10-CM

## 2020-01-27 NOTE — Progress Notes (Signed)
   Covid-19 Vaccination Clinic  Name:  Keo Schirmer    MRN: [id number] DOB: 1961/03/14  01/27/2020  Mr. Anctil was observed post Covid-19 immunization for 15 minutes without incident. He was provided with Vaccine Information Sheet and instruction to access the V-Safe system.   Mr. Hattabaugh was instructed to call 911 with any severe reactions post vaccine: Marland Kitchen Difficulty breathing  . Swelling of face and throat  . A fast heartbeat  . A bad rash all over body  . Dizziness and weakness   Immunizations Administered    Name Date Dose VIS Date Route   Pfizer COVID-19 Vaccine 01/27/2020  3:03 PM 0.3 mL 10/14/2019 Intramuscular   Manufacturer: [employer], [employer]   Lot: [id number]   NDC: [address]-8

## 2020-02-21 ENCOUNTER — Ambulatory Visit: Payer: BC Managed Care – PPO | Attending: Internal Medicine

## 2020-02-21 DIAGNOSIS — Z23 Encounter for immunization: Secondary | ICD-10-CM

## 2020-02-21 NOTE — Progress Notes (Signed)
   Covid-19 Vaccination Clinic  Name:  Gary Colon    MRN: [id number] DOB: 02/10/1961  02/21/2020  [name] was observed post Covid-19 immunization for 30 minutes based on pre-vaccination screening without incident. He was provided with Vaccine Information Sheet and instruction to access the V-Safe system.   [name] was instructed to call 911 with any severe reactions post vaccine: Marland Kitchen Difficulty breathing  . Swelling of face and throat  . A fast heartbeat  . A bad rash all over body  . Dizziness and weakness   Immunizations Administered    Name Date Dose VIS Date Route   Pfizer COVID-19 Vaccine 02/21/2020  2:48 PM 0.3 mL 12/28/2018 Intramuscular   Manufacturer: [employer], [employer]   Lot: [id number]   NDC: [address]-9

## 2020-10-08 ENCOUNTER — Ambulatory Visit: Payer: BC Managed Care – PPO | Attending: Internal Medicine

## 2020-10-08 DIAGNOSIS — Z23 Encounter for immunization: Secondary | ICD-10-CM

## 2020-10-08 NOTE — Progress Notes (Signed)
   Covid-19 Vaccination Clinic  Name:  Gary Colon    MRN: [id number] DOB: 05-14-1961  10/08/2020  Gary Colon was observed post Covid-19 immunization for 15 minutes without incident. He was provided with Vaccine Information Sheet and instruction to access the V-Safe system.   Gary Colon was instructed to call 911 with any severe reactions post vaccine: Marland Kitchen Difficulty breathing  . Swelling of face and throat  . A fast heartbeat  . A bad rash all over body  . Dizziness and weakness   Immunizations Administered    Name Date Dose VIS Date Route   Pfizer COVID-19 Vaccine 10/08/2020  1:29 PM 0.3 mL 08/22/2020 Intramuscular   Manufacturer: [employer], [employer]   Lot: [id number]   NDC: [address]-6

## 2022-11-17 ENCOUNTER — Encounter: Payer: Self-pay | Admitting: Gastroenterology

## 2023-11-16 ENCOUNTER — Telehealth: Payer: Self-pay

## 2023-11-16 NOTE — Telephone Encounter (Signed)
 Copied from CRM [id number]. Topic: Referral - Question >> Nov 16, 2023 11:05 AM Bobbye Morton wrote: Reason for CRM: PT calling in to scheudle an appt with an urologist. Requesting a call back Wife, Maureen Ralphs at  [phone]

## 2024-11-01 DIAGNOSIS — E0590 Thyrotoxicosis, unspecified without thyrotoxic crisis or storm: Secondary | ICD-10-CM

## 2024-11-23 ENCOUNTER — Encounter (HOSPITAL_COMMUNITY): Payer: Self-pay

## 2024-11-23 ENCOUNTER — Other Ambulatory Visit (HOSPITAL_COMMUNITY): Payer: Self-pay

## 2024-11-24 ENCOUNTER — Other Ambulatory Visit (HOSPITAL_COMMUNITY): Payer: Self-pay
